# Patient Record
Sex: Male | Born: 1950 | Race: White | Hispanic: No | Marital: Married | Smoking: Former smoker
Health system: Southern US, Community
[De-identification: ages and names within clinical notes are randomized; demographics above are authoritative.]

## PROBLEM LIST (undated history)

## (undated) DIAGNOSIS — Z973 Presence of spectacles and contact lenses: Secondary | ICD-10-CM

## (undated) DIAGNOSIS — S82843A Displaced bimalleolar fracture of unspecified lower leg, initial encounter for closed fracture: Secondary | ICD-10-CM

## (undated) DIAGNOSIS — I493 Ventricular premature depolarization: Secondary | ICD-10-CM

## (undated) DIAGNOSIS — F32A Depression, unspecified: Secondary | ICD-10-CM

## (undated) DIAGNOSIS — F4310 Post-traumatic stress disorder, unspecified: Secondary | ICD-10-CM

## (undated) DIAGNOSIS — Z87442 Personal history of urinary calculi: Secondary | ICD-10-CM

## (undated) DIAGNOSIS — H2000 Unspecified acute and subacute iridocyclitis: Secondary | ICD-10-CM

## (undated) DIAGNOSIS — F329 Major depressive disorder, single episode, unspecified: Secondary | ICD-10-CM

## (undated) DIAGNOSIS — L309 Dermatitis, unspecified: Secondary | ICD-10-CM

## (undated) DIAGNOSIS — R0789 Other chest pain: Secondary | ICD-10-CM

## (undated) DIAGNOSIS — I499 Cardiac arrhythmia, unspecified: Secondary | ICD-10-CM

## (undated) DIAGNOSIS — R3911 Hesitancy of micturition: Secondary | ICD-10-CM

## (undated) HISTORY — PX: APPENDECTOMY: SHX54

## (undated) HISTORY — DX: Ventricular premature depolarization: I49.3

## (undated) HISTORY — DX: Post-traumatic stress disorder, unspecified: F43.10

## (undated) HISTORY — PX: HERNIA REPAIR: SHX51

## (undated) HISTORY — DX: Other chest pain: R07.89

## (undated) HISTORY — PX: ABLATION: SHX5711

---

## 2004-09-06 ENCOUNTER — Ambulatory Visit (HOSPITAL_COMMUNITY): Admission: RE | Admit: 2004-09-06 | Discharge: 2004-09-06 | Payer: Self-pay | Admitting: *Deleted

## 2011-02-28 ENCOUNTER — Ambulatory Visit (HOSPITAL_COMMUNITY)
Admission: RE | Admit: 2011-02-28 | Discharge: 2011-02-28 | Disposition: A | Discharge status: Home / Self Care | Payer: BC Managed Care – PPO | Source: Ambulatory Visit | Attending: Cardiology | Admitting: Cardiology

## 2011-02-28 DIAGNOSIS — I499 Cardiac arrhythmia, unspecified: Secondary | ICD-10-CM | POA: Insufficient documentation

## 2011-04-08 ENCOUNTER — Observation Stay (HOSPITAL_COMMUNITY)
Admission: EM | Admit: 2011-04-08 | Discharge: 2011-04-08 | Disposition: A | Discharge status: Home / Self Care | Payer: BC Managed Care – PPO | Attending: Internal Medicine | Admitting: Internal Medicine

## 2011-04-08 ENCOUNTER — Emergency Department (HOSPITAL_COMMUNITY): Payer: BC Managed Care – PPO

## 2011-04-08 DIAGNOSIS — R0602 Shortness of breath: Secondary | ICD-10-CM | POA: Insufficient documentation

## 2011-04-08 DIAGNOSIS — R0789 Other chest pain: Principal | ICD-10-CM | POA: Insufficient documentation

## 2011-04-08 DIAGNOSIS — R61 Generalized hyperhidrosis: Secondary | ICD-10-CM | POA: Insufficient documentation

## 2011-04-08 DIAGNOSIS — F4310 Post-traumatic stress disorder, unspecified: Secondary | ICD-10-CM | POA: Insufficient documentation

## 2011-04-08 LAB — CK TOTAL AND CKMB (NOT AT ARMC): CK, MB: 3.7 ng/mL (ref 0.3–4.0)

## 2011-04-08 LAB — BASIC METABOLIC PANEL
Calcium: 8.9 mg/dL (ref 8.4–10.5)
GFR calc non Af Amer: >60 mL/min (ref >60)
Glucose, Bld: 132 mg/dL — ABNORMAL HIGH (ref 70–99)
Sodium: 140 mEq/L (ref 135–145)

## 2011-04-08 LAB — LIPID PANEL
HDL: 49 mg/dL (ref >39)
LDL Cholesterol: 138 mg/dL — ABNORMAL HIGH (ref 0–99)
Total CHOL/HDL Ratio: 4.6 RATIO
Triglycerides: 178 mg/dL — ABNORMAL HIGH (ref <150)
VLDL: 36 mg/dL (ref 0–40)

## 2011-04-08 LAB — CARDIAC PANEL(CRET KIN+CKTOT+MB+TROPI)
Relative Index: INVALID (ref 0.0–2.5)
Troponin I: <0.3 ng/mL (ref <0.30)
Troponin I: <0.3 ng/mL (ref <0.30)

## 2011-04-08 LAB — CBC
Hemoglobin: 14.9 g/dL (ref 13.0–17.0)
MCH: 30.8 pg (ref 26.0–34.0)
MCHC: 36.1 g/dL — ABNORMAL HIGH (ref 30.0–36.0)

## 2011-04-08 LAB — DIFFERENTIAL
Basophils Relative: 1 % (ref 0–1)
Eosinophils Absolute: 0.1 10*3/uL (ref 0.0–0.7)
Monocytes Absolute: 0.4 10*3/uL (ref 0.1–1.0)
Monocytes Relative: 7 % (ref 3–12)
Neutro Abs: 4.2 10*3/uL (ref 1.7–7.7)

## 2011-04-30 ENCOUNTER — Encounter: Payer: Self-pay | Admitting: Internal Medicine

## 2011-05-01 ENCOUNTER — Ambulatory Visit (INDEPENDENT_AMBULATORY_CARE_PROVIDER_SITE_OTHER): Payer: BC Managed Care – PPO | Admitting: Internal Medicine

## 2011-05-01 ENCOUNTER — Encounter: Payer: Self-pay | Admitting: Internal Medicine

## 2011-05-01 DIAGNOSIS — E785 Hyperlipidemia, unspecified: Secondary | ICD-10-CM

## 2011-05-01 DIAGNOSIS — I493 Ventricular premature depolarization: Secondary | ICD-10-CM | POA: Insufficient documentation

## 2011-05-01 DIAGNOSIS — I4949 Other premature depolarization: Secondary | ICD-10-CM

## 2011-05-01 NOTE — Progress Notes (Signed)
HPI Peter Hensley is referred today by Dr. Donato Schultz for evaluation of frequent ventricular ectopy. The patient notes that he has had a history of an irregular heartbeat for several years. This appears to have increased in frequency and severity. Despite this he has remained well. He is active and walks runs on a regular basis. He does not think he has had any limitation in his activity level. He has never had frank syncope. Initial evaluation demonstrated frequent premature ventricular contractions at times in a bigeminal fashion. Subsequent cardiac monitoring has demonstrated that he has had over 34,000 premature ventricular beats and a 24-hour period. In addition a 2-D echo has demonstrated low normal left ventricular systolic function. He has minimal to no palpitations. Compared to several years ago, he thinks that his energy level may have decreased somewhat. No Known Allergies   Current Outpatient Prescriptions  Medication Sig Dispense Refill  . acetaminophen-codeine (TYLENOL #3) 300-30 MG per tablet Take 1 tablet by mouth every 4 (four) hours as needed.        Marland Kitchen buPROPion (WELLBUTRIN) 75 MG tablet Take 75 mg by mouth 2 (two) times daily.        . divalproex (DEPAKOTE) 500 MG 24 hr tablet Take 500 mg by mouth daily.        . meloxicam (MOBIC) 15 MG tablet Take 15 mg by mouth daily.        . prazosin (MINIPRESS) 5 MG capsule Take 5 mg by mouth at bedtime.        . sertraline (ZOLOFT) 50 MG tablet Take 50 mg by mouth daily.        . simvastatin (ZOCOR) 40 MG tablet Take 40 mg by mouth at bedtime.        . traZODone (DESYREL) 100 MG tablet Take 100 mg by mouth at bedtime.           Past Medical History  Diagnosis Date  . PVC's (premature ventricular contractions)   . Post traumatic stress disorder     with occasional panic attacks  . Atypical chest pain     ROS:   All systems reviewed and negative except as noted in the HPI.   No past surgical history on file.   No family history  on file.   History   Social History  . Marital Status: Married    Spouse Name: N/A    Number of Children: N/A  . Years of Education: N/A   Occupational History  . Not on file.   Social History Main Topics  . Smoking status: Never Smoker   . Smokeless tobacco: Not on file  . Alcohol Use: Yes     Occas.   . Drug Use: No  . Sexually Active: Not on file   Other Topics Concern  . Not on file   Social History Narrative   He is a [place] vet. Highly functioning and exercises on a regular basis.      BP 119/64  Pulse 91  Ht 5\' 10"  (1.778 m)  Wt 185 lb (83.915 kg)  BMI 26.54 kg/m2  Physical Exam:  Well appearing NAD HEENT: Unremarkable Neck:  No JVD, no thyromegally Lymphatics:  No adenopathy Back:  No CVA tenderness Lungs:  Clear HEART:  Iregular rate rhythm, no murmurs, no rubs, no clicks Abd:  soft, positive bowel sounds, no organomegally, no rebound, no guarding Ext:  2 plus pulses, no edema, no cyanosis, no clubbing Skin:  No rashes no nodules Neuro:  CN  II through XII intact, motor grossly intact  EKG Normal sinus rhythm with frequent ventricular ectopy in a bigeminal fashion  Assess/Plan:

## 2011-05-01 NOTE — Assessment & Plan Note (Signed)
I have discussed the treatment options with the patient. Although he is minimally symptomatic, is very dense ventricular ectopy and his low normal ejection fraction suggests that he will do ultimately go on and develop a tachycardia induced cardiomyopathy. Treatment options were carefully laid out. Medical therapy would certainly be our first option though frequently medications are unhelpful in this problem. After discussing his medical options, the patient strongly prefers not to try taking medications even though I have informed him that this is my first choice for most patients. The patient is interested in proceeding with catheter ablation. The risks, goals, benefits, and expectations of the procedure have been discussed with the patient and he would like to call us when he chooses to proceed with this therapy.

## 2011-05-01 NOTE — Assessment & Plan Note (Signed)
He will continue his current medical therapy. Regular exercise and a low fat diet are recommended.

## 2011-05-02 NOTE — Discharge Summary (Signed)
  NAME:  Peter Hensley, SHELLER NO.:  [id number]  MEDICAL RECORD NO.:  [id number]  LOCATION:  3707                         FACILITY:  [facility]  PHYSICIAN:  Zannie Cove, MD     DATE OF BIRTH:  05/19/1951  DATE OF ADMISSION:  04/08/2011 DATE OF DISCHARGE:  04/08/2011                              DISCHARGE SUMMARY   PRIMARY CARE PHYSICIAN:  Unassigned.  CARDIOLOGIST:  Jake Bathe, MD, [facility]  DISCHARGE DIAGNOSES: 1. Atypical chest pain likely secondary to panic attack versus     premature ventricular contractions, resolved. 2. History of stress test 2 months ago, normal. 3. Post-traumatic stress disorder. 4. History of panic attacks.  DISCHARGE MEDICATIONS: 1. Adderall 20 mg 1/2 tablet daily. 2. Aspirin 81 mg daily. 3. Bupropion 1 tablet daily. 4. Prazosin 1 tablet daily. 5. Sertraline 1 tablet daily. 6. Simvastatin 80 mg 1/2 tablet daily. 7. Trazodone 50 mg 1 tablet daily.  DIAGNOSTICS/INVESTIGATIONS:  Chest x-ray showed no acute abnormalities. 2-D echocardiogram showed LV cavity size normal, EF of 50%, and wall motion is normal.  HOSPITAL COURSE:  [name] is a pleasant 60 year old [place] veteran who presented to the hospital with some chest tightness.  He had some panic attacks and flashbacks the night before and subsequently admitted to the hospital, ruled out for MI based on 3 sets of cardiac enzymes and had a normal 2-D echo done without any wall motion abnormality.  In addition, the patient also had a stress test done 2 months ago per [facility] which was reportedly normal.  Hence, the patient is being discharged in stable condition to follow up with Dr. Anne Fu for possible evaluation for PVCs or arrhythmias, etc.  DISCHARGE CONDITION:  Stable.  DISCHARGE FOLLOWUP:  Dr. Donato Schultz in 2-3 weeks.     Zannie Cove, MD     PJ/MEDQ  D:  05/01/2011  T:  05/01/2011  Job:  [id number]  Electronically Signed by Zannie Cove  on  05/02/2011 01:30:38 PM

## 2011-05-10 NOTE — H&P (Signed)
NAME:  Peter Hensley, Peter Hensley NO.:  [id number]  MEDICAL RECORD NO.:  [id number]  LOCATION:  [facility]                         FACILITY:  [facility]  PHYSICIAN:  Tarry Kos, MD       DATE OF BIRTH:  03/05/1951  DATE OF ADMISSION:  04/08/2011 DATE OF DISCHARGE:                             HISTORY & PHYSICAL   CHIEF COMPLAINT:  Chest pressure.  HISTORY OF PRESENT ILLNESS:  Peter Hensley is a pleasant 60 year old male who has a history of PTSD a [place] veteran who has no other medical issues who presents to emergency department after being woken up from sleep with chest tightness that was really in his lower chest, epigastric area, and was very short of breath and diaphoretic, lasted about 10 minutes.  He called EMS and was told to take some aspirin, which relieved his chest pressure.  He has a history of panic attacks from his PTSD, and he frequently does have flashbacks and nightmares at night, and he thinks that he was having some flashbacks whenever he was woken up tonight, but he normally does not have chest pressure with his panic attacks, so it worried him and that is why he called EMS.  Since he has been in the emergency room, he has been chest pain-free.  The only thing he got was aspirin.  He exercises regularly.  He denies any recent fevers, nausea, vomiting, diarrhea, or any recent cough.  PAST MEDICAL HISTORY:  PVCs, post-traumatic stress disorder with occasional panic attacks.  SOCIAL HISTORY:  He is a [place] vet.  He is a nonsmoker, never smoked. Occasional alcohol and no other drugs.  He is highly functioning and exercises on a regular basis.  MEDICATIONS:  He takes bupropion, prazosin, sertraline, and trazodone.  ALLERGIES:  None.  PHYSICAL EXAMINATION:  VITAL SIGNS:  Temperature is 97.6, blood pressure 119/76, pulse 63, respirations 11, 100% O2 sats on room air. GENERAL:  He is alert and oriented x4, in no apparent stress, cooperative, friendly, appears  much younger than his stated age. HEENT:  Extraocular muscles intact.  Pupils equal and reactive to light. Oropharynx is clear.  Mucous membranes are moist. NECK:  No JVD.  No carotid bruits. COR:  Regular rate and rhythm without murmurs or gallops. CHEST:  Clear to auscultation bilaterally.  No wheezes, rhonchi or rales. ABDOMEN:  Soft, nontender, nondistended.  Positive bowel sounds.  No hepatosplenomegaly. EXTREMITIES:  Without clubbing,  cyanosis, or edema. PSYCH:  Normal mood and affect. NEURO:  No focal neurologic deficits. SKIN:  No rashes.  LABORATORY DATA:  Cardiac enzymes were negative.  A 12-lead EKG, normal sinus rhythm with occasional PVCs.  Electrolytes normal.  BUN and creatinine normal.  White count normal.  Hemoglobin normal.  Chest x-ray is negative.  ASSESSMENT AND PLAN:  This is a 60 year old male with atypical chest pain. 1. Atypical chest pain.  We will continue to rule out serial cardiac     enzymes and obtain a 2D echo.  However, this could probably be done     as an outpatient and he probably can get further outpatient stress     testing also with the [place].  This almost sounds  like it is related to     his posttraumatic stress disorder, but will obviously do serial     cardiac enzymes and rule out a cardiac source with further     outpatient workup if he rules out.  Place on telemetry and monitor     for any arrhythmias. 2. Posttraumatic stress disorder.  We will continue his home     medications for that. 3. The patient is full code.  Further recommendations pending on     overall hospital course.  We will also check a lipid panel in the     morning, fasting lipid panel.          ______________________________ Tarry Kos, MD     RD/MEDQ  D:  04/08/2011  T:  04/08/2011  Job:  [id number]  Electronically Signed by Tarry Kos MD on 05/10/2011 11:47:43 AM

## 2011-05-24 ENCOUNTER — Ambulatory Visit (INDEPENDENT_AMBULATORY_CARE_PROVIDER_SITE_OTHER): Payer: BC Managed Care – PPO | Admitting: *Deleted

## 2011-05-24 ENCOUNTER — Encounter: Payer: Self-pay | Admitting: *Deleted

## 2011-05-24 DIAGNOSIS — I493 Ventricular premature depolarization: Secondary | ICD-10-CM

## 2011-05-24 DIAGNOSIS — E785 Hyperlipidemia, unspecified: Secondary | ICD-10-CM

## 2011-05-24 DIAGNOSIS — I4949 Other premature depolarization: Secondary | ICD-10-CM

## 2011-05-24 LAB — CBC WITH DIFFERENTIAL/PLATELET
Basophils Relative: 0.7 % (ref 0.0–3.0)
Eosinophils Relative: 2.4 % (ref 0.0–5.0)
HCT: 46.8 % (ref 39.0–52.0)
Hemoglobin: 16.1 g/dL (ref 13.0–17.0)
Lymphs Abs: 1.6 10*3/uL (ref 0.7–4.0)
MCV: 89.5 fl (ref 78.0–100.0)
Monocytes Absolute: 0.4 10*3/uL (ref 0.1–1.0)
RBC: 5.23 Mil/uL (ref 4.22–5.81)
WBC: 5.1 10*3/uL (ref 4.5–10.5)

## 2011-05-24 LAB — BASIC METABOLIC PANEL
Chloride: 103 mEq/L (ref 96–112)
Potassium: 4.1 mEq/L (ref 3.5–5.1)
Sodium: 140 mEq/L (ref 135–145)

## 2011-05-24 LAB — PROTIME-INR: INR: 0.9 ratio (ref 0.8–1.0)

## 2011-05-28 ENCOUNTER — Telehealth: Payer: Self-pay | Admitting: *Deleted

## 2011-05-28 NOTE — Telephone Encounter (Signed)
lmom for pt to call me back regarding his procedure for 06/08/11

## 2011-05-29 NOTE — Telephone Encounter (Signed)
Procedure date changed.  Pt aware

## 2011-06-01 ENCOUNTER — Encounter: Payer: Self-pay | Admitting: Internal Medicine

## 2011-06-05 ENCOUNTER — Encounter: Payer: Self-pay | Admitting: Internal Medicine

## 2011-06-06 ENCOUNTER — Ambulatory Visit (INDEPENDENT_AMBULATORY_CARE_PROVIDER_SITE_OTHER): Payer: BC Managed Care – PPO | Admitting: *Deleted

## 2011-06-06 ENCOUNTER — Telehealth: Payer: Self-pay | Admitting: Internal Medicine

## 2011-06-06 DIAGNOSIS — I493 Ventricular premature depolarization: Secondary | ICD-10-CM

## 2011-06-06 DIAGNOSIS — E785 Hyperlipidemia, unspecified: Secondary | ICD-10-CM

## 2011-06-06 DIAGNOSIS — I43 Cardiomyopathy in diseases classified elsewhere: Secondary | ICD-10-CM

## 2011-06-06 DIAGNOSIS — R0989 Other specified symptoms and signs involving the circulatory and respiratory systems: Secondary | ICD-10-CM

## 2011-06-06 LAB — CBC WITH DIFFERENTIAL/PLATELET
Eosinophils Absolute: 0.1 10*3/uL (ref 0.0–0.7)
Hemoglobin: 15.1 g/dL (ref 13.0–17.0)
Lymphocytes Relative: 32 % (ref 12–46)
Lymphs Abs: 1.7 10*3/uL (ref 0.7–4.0)
MCH: 30.8 pg (ref 26.0–34.0)
Monocytes Relative: 6 % (ref 3–12)
Neutrophils Relative %: 58 % (ref 43–77)
RBC: 4.9 MIL/uL (ref 4.22–5.81)
WBC: 5.3 10*3/uL (ref 4.0–10.5)

## 2011-06-06 LAB — PROTIME-INR: INR: 0.95 (ref <1.50)

## 2011-06-06 LAB — BASIC METABOLIC PANEL WITH GFR
CO2: 26 mEq/L (ref 19–32)
GFR, Est African American: >60 mL/min (ref >60)
Glucose, Bld: 87 mg/dL (ref 70–99)
Potassium: 3.7 mEq/L (ref 3.5–5.3)
Sodium: 141 mEq/L (ref 135–145)

## 2011-06-06 NOTE — Telephone Encounter (Signed)
Called patient back. He states that he is calling Peter Hensley back concerning lab work. Advised that she is out to lunch and will call him back in about 1 hour.

## 2011-06-06 NOTE — Telephone Encounter (Signed)
Called patient and LM on private cell phone to have lab work done today prior to 430pm.

## 2011-06-06 NOTE — Telephone Encounter (Signed)
Patient calling regarding blood work &  procedure. Pt states lab work was done on 8/9.

## 2011-06-06 NOTE — Telephone Encounter (Signed)
Pt rtn call regarding lab work for procedure for tomorrow had it done already

## 2011-06-07 ENCOUNTER — Ambulatory Visit (HOSPITAL_COMMUNITY)
Admission: RE | Admit: 2011-06-07 | Discharge: 2011-06-08 | Disposition: A | Discharge status: Home / Self Care | Payer: BC Managed Care – PPO | Source: Ambulatory Visit | Attending: Internal Medicine | Admitting: Internal Medicine

## 2011-06-07 DIAGNOSIS — I4729 Other ventricular tachycardia: Secondary | ICD-10-CM | POA: Insufficient documentation

## 2011-06-07 DIAGNOSIS — I472 Ventricular tachycardia, unspecified: Secondary | ICD-10-CM | POA: Insufficient documentation

## 2011-06-07 DIAGNOSIS — I4949 Other premature depolarization: Secondary | ICD-10-CM | POA: Insufficient documentation

## 2011-06-07 DIAGNOSIS — E785 Hyperlipidemia, unspecified: Secondary | ICD-10-CM | POA: Insufficient documentation

## 2011-06-07 DIAGNOSIS — F410 Panic disorder [episodic paroxysmal anxiety] without agoraphobia: Secondary | ICD-10-CM | POA: Insufficient documentation

## 2011-07-04 ENCOUNTER — Ambulatory Visit (INDEPENDENT_AMBULATORY_CARE_PROVIDER_SITE_OTHER): Payer: BC Managed Care – PPO | Admitting: Internal Medicine

## 2011-07-04 ENCOUNTER — Encounter: Payer: Self-pay | Admitting: Internal Medicine

## 2011-07-04 VITALS — BP 110/70 | HR 93 | Ht 69.0 in | Wt 182.4 lb

## 2011-07-04 DIAGNOSIS — I493 Ventricular premature depolarization: Secondary | ICD-10-CM

## 2011-07-04 DIAGNOSIS — I4949 Other premature depolarization: Secondary | ICD-10-CM

## 2011-07-04 NOTE — Progress Notes (Signed)
HPI Peter Hensley returns today for followup. 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 He was a fracture to medical therapy and underwent catheter ablation of his PVCs. The PVCs originated from the right ventricular outflow tract and they were successfully ablated. He is much improved. His weakness and fatigue have resolved. He has no palpitations. He is off medical therapy. He has had no syncope. No Known Allergies   Current Outpatient Prescriptions  Medication Sig Dispense Refill  . acetaminophen-codeine (TYLENOL #3) 300-30 MG per tablet Take 1 tablet by mouth every 4 (four) hours as needed.        Marland Kitchen buPROPion (WELLBUTRIN) 75 MG tablet Take 75 mg by mouth 2 (two) times daily.        . divalproex (DEPAKOTE) 500 MG 24 hr tablet Take 500 mg by mouth daily.        . meloxicam (MOBIC) 15 MG tablet Take 15 mg by mouth daily.        . prazosin (MINIPRESS) 5 MG capsule Take 5 mg by mouth at bedtime.        . sertraline (ZOLOFT) 50 MG tablet Take 50 mg by mouth daily.        . simvastatin (ZOCOR) 40 MG tablet Take 40 mg by mouth at bedtime.        . traZODone (DESYREL) 100 MG tablet Take 100 mg by mouth at bedtime.           Past Medical History  Diagnosis Date  . PVC's (premature ventricular contractions)   . Post traumatic stress disorder     with occasional panic attacks  . Atypical chest pain     ROS:   All systems reviewed and negative except as noted in the HPI.   No past surgical history on file.   No family history on file.   History   Social History  . Marital Status: Married    Spouse Name: N/A    Number of Children: N/A  . Years of Education: N/A   Occupational History  . Not on file.   Social History Main Topics  . Smoking status: Former Smoker    Quit date: 07/03/1996  . Smokeless tobacco: Not on file  . Alcohol Use: Yes     Occas.   . Drug Use: No  . Sexually  Active: Not on file   Other Topics Concern  . Not on file   Social History Narrative   He is a [place] vet. Highly functioning and exercises on a regular basis.      BP 110/70  Pulse 93  Ht 5\' 9"  (1.753 m)  Wt 182 lb 6.4 oz (82.736 kg)  BMI 26.94 kg/m2  Physical Exam:  Well appearing NAD HEENT: Unremarkable Neck:  No JVD, no thyromegally Lymphatics:  No adenopathy Back:  No CVA tenderness Lungs:  Clear HEART:  Regular rate rhythm, no murmurs, no rubs, no clicks Abd:  soft, positive bowel sounds, no organomegally, no rebound, no guarding Ext:  2 plus pulses, no edema, no cyanosis, no clubbing Skin:  No rashes no nodules Neuro:  CN II through XII intact, motor grossly intact  EKG Normal sinus rhythm  Assess/Plan:

## 2011-07-04 NOTE — Assessment & Plan Note (Signed)
His symptoms have resolved. I have recommended a period of watchful waiting. He'll call me if he has recurrence.

## 2011-07-12 NOTE — Progress Notes (Signed)
Addended by: Burnett Kanaris A on: 07/12/2011 01:56 PM   Modules accepted: Orders

## 2011-07-16 ENCOUNTER — Telehealth: Payer: Self-pay | Admitting: Internal Medicine

## 2011-07-17 NOTE — Telephone Encounter (Signed)
Discussed with Dr Ladona Ridgel and he says as long as he continues to feel okay, we are going to keep a watch on things and not make any changes at this point  He will call if these progress of do not get any better

## 2011-07-26 NOTE — Op Note (Signed)
NAME:  Peter Hensley, UHRICH NO.:  [id number]  MEDICAL RECORD NO.:  [id number]  LOCATION:  2807                         FACILITY:  [facility]  PHYSICIAN:  Doylene Canning. Ladona Ridgel, MD    DATE OF BIRTH:  05-07-1951  DATE OF PROCEDURE:  06/07/2011 DATE OF DISCHARGE:                              OPERATIVE REPORT   PROCEDURE PERFORMED:  Ablation of VT/PVCs in a bigeminal fashion from the RV outflow tract.  INTRODUCTION:  The patient is a 60 year old man with a history of symptomatic tachy palpitations and documented dense ventricular ectopy. He had 34,000 PVCs in a 24-hour period.  The patient was treated with medical therapy, but did not have improvement in his symptoms and he is now referred for catheter ablation.  DESCRIPTION OF PROCEDURE:  After informed consent was obtained, the patient was taken to the diagnostic EP lab in a fasting state.  After usual preparation and draping, lidocaine was infiltrated into the right inguinal region as well as right jugular region.  A 6-French quadripolar catheter was inserted percutaneously in the right femoral vein and advanced into the right ventricle.  A 6-French quadripolar catheter was inserted percutaneously in the right femoral vein and advanced to his bundle region.  A 6-French hexapolar catheter was inserted percutaneously in the right jugular vein and advanced to coronary sinus. Mapping was carried out demonstrating PVCs typically in a bigeminal fashion.  These appeared to be coming from the RV outflow tract with transition from negative to positive at V3.  The 7-French quadripolar electroanatomic ablation catheter was then advanced into the right femoral vein and up into the right ventricle.  A 3D electroanatomic mapping was carried out first to make a shell of the right ventricle and then to map the activation sequence of the patient's PVCs.  Care was taken not to map the catheter-induced PVCs.  This demonstrated that  the location of the PVCs was in the RV outflow tract at a location approximately 2 cm below the pulmonic valve.  The PVCs were somewhat anterior on the RV outflow tract.  In the LAO projection, they were leftward.  A total of 11 RF energy applications were subsequently delivered with electroanatomic mapping.  This resulted in immediate cessation of PVCs.  The patient was then observed for 30 minutes. During this time, rapid ventricular pacing and rapid atrial pacing was carried out from the right ventricle and the coronary sinus respectively and programmed ventricular stimulation and programmed atrial stimulation was carried out from the right ventricle and from the coronary sinus respectively.  There was no inducible VT and there were no additional PVCs.  The catheter was then removed.  Hemostasis was assured and the patient was returned to his room in satisfactory condition.  COMPLICATIONS:  There were no immediate procedure complications.  RESULTS:  A.  Baseline ECG.  Baseline ECG demonstrates sinus rhythm with PVCs in a bigeminal fashion.B.  Baseline intervals, sinus node cycle length was 895 milliseconds. HV interval was 39 milliseconds.  The QRS duration was 100 milliseconds. C.  Rapid ventricular pacing.  Rapid ventricular pacing following ablation demonstrated no inducible VT.  The pacing interval stepwise decreased down to 300 milliseconds. D.  Programmed ventricular simulation.  Programmed ventricular stimulation was carried out down to the S1-S2 coupling interval of 600/540 and stepwise decreased down to 400 milliseconds.  The retrograde AV node ERP was demonstrated.  During programmed ventricular stimulation, the atrial activation was midline decremental.  No inducible arrhythmias. E.  Rapid atrial pacing.  Rapid atrial pacing was carried out from the coronary sinus at a pacing cycle length of 600 milliseconds, stepwise decreased down to 80 milliseconds demonstrating AV  Wenckebach.  During rapid atrial pacing, the PR interval was less than the RR interval and there was no inducible VT or SVT. F.  Programmed atrial stimulation.  Programmed atrial stimulation was carried out from the coronary sinus at base drive cycle of 409 milliseconds.  The S1 and S2 interval was stepwise decreased down to 20 milliseconds with AV node ERP was observed.  During programmed atrial stimulation, there were no AH jumps, no echo beats, and no inducible SVT or VT. G.  Arrhythmias observed. 1. Nonsustained VT and bigeminal PVCs, initiation present at the time     of EP study, the duration was sustained, termination was with     catheter ablation.     a.     Mapping.  Mapping of the PVCs demonstrated that the earliest      activation was in the right ventricular outflow tract      approximately 2 cm below the pulmonic valve annulus on the      anterior portion of the RV outflow tract.     b.     RF energy application.  A total of 11 RF energy applications      were delivered to the RV outflow tract at the earliest ventricular      activation of the PVCs resulting in termination of the PVCs.  They      were not inducible.  CONCLUSION:  This study demonstrates successful electrophysiologic study and catheter ablation of dense ventricular ectopy originating from the RV outflow tract.  A total of 11 RF energy applications were delivered resulting in termination of the patient's ventricular ectopy.  Of note, the patient was observed for 30 minutes, and during this time, he was having ventricular bigemini to no PVCs.     Doylene Canning. Ladona Ridgel, MD     GWT/MEDQ  D:  06/07/2011  T:  06/07/2011  Job:  [id number]  cc:   Jake Bathe, MD  Electronically Signed by Lewayne Bunting MD on 07/26/2011 06:39:38 PM

## 2011-07-26 NOTE — Discharge Summary (Signed)
  NAME:  AZIAH, BROSTROM NO.:  [id number]  MEDICAL RECORD NO.:  [id number]  LOCATION:  3737                         FACILITY:  [facility]  PHYSICIAN:  Doylene Canning. Ladona Ridgel, MD    DATE OF BIRTH:  May 07, 1951  DATE OF ADMISSION:  06/07/2011 DATE OF DISCHARGE:  06/08/2011                              DISCHARGE SUMMARY   PRIMARY CARDIOLOGIST:  Jake Bathe, MD  ELECTROPHYSIOLOGIST:  Doylene Canning. Ladona Ridgel, MD  DISCHARGE DIAGNOSIS:  Symptomatic premature ventricular contractions.  SECONDARY DIAGNOSES: 1. History of atypical chest pain. 2. History of posttraumatic stress disorder. 3. Panic attacks. 4. Hyperlipidemia.  ALLERGIES:  No known drug allergies.  PROCEDURES:  On June 07, 2011, ablation of VT/PVCs in a bigeminal fashion from the RV outflow tract.  HISTORY OF PRESENT ILLNESS:  A 60 year old male with prior history of symptomatic PVCs, recently seen in clinic by Dr. Ladona Ridgel on May 01, 2011.  At that time, prior cardiac monitoring was reviewed showing over 34,000 PVCs in the 24-hour period.  Decision was made to pursue PVC ablation.  HOSPITAL COURSE:  The patient presented to the EP Lab on June 07, 2011, where he underwent successful VT/PVC ablation.  Postprocedure, he was ambulating without recurrent symptoms or limitations, would be discharged to home today in good condition.  DISCHARGE LABS:  None.  DISPOSITION:  The patient is being discharged home today in good condition.  FOLLOWUP PLANS AND APPOINTMENTS:  The patient will follow up with Dr. Lewayne Bunting on July 04, 2011 at 2 o'clock p.m.  Follow up with Dr. Anne Fu as previously scheduled.  DISCHARGE MEDICATIONS: 1. Adderall 20 mg half a tablet daily. 2. Aspirin 81 mg daily. 3. Divalproex 500 mg daily. 4. Prazosin 2 mg daily. 5. Sertraline 100 mg daily. 6. Simvastatin 80 mg half a tablet daily. 7. Trazodone 100 mg daily.  OUTSTANDING LABORATORY STUDIES:  None.  DURATION OF DISCHARGE  ENCOUNTERED:  Thirty five minutes including physician.     Nicolasa Ducking, ANP   ______________________________ Doylene Canning. Ladona Ridgel, MD    CB/MEDQ  D:  06/08/2011  T:  06/08/2011  Job:  [id number]  cc:   Jake Bathe, MD  Electronically Signed by Nicolasa Ducking ANP on 06/28/2011 03:15:26 PM Electronically Signed by Lewayne Bunting MD on 07/26/2011 06:39:43 PM

## 2011-08-14 ENCOUNTER — Telehealth: Payer: Self-pay | Admitting: Internal Medicine

## 2011-08-14 NOTE — Telephone Encounter (Signed)
[facility]bs said she cant reach pt case management services

## 2011-08-14 NOTE — Telephone Encounter (Signed)
Left message for Peter Hensley also and let her know that i had talked with the patient several weeks ago and not sure what else she wanted me to do

## 2011-08-14 NOTE — Telephone Encounter (Signed)
Tried to call and got pt's voicemail

## 2012-09-04 ENCOUNTER — Other Ambulatory Visit: Payer: Self-pay | Admitting: Orthopedic Surgery

## 2012-09-04 DIAGNOSIS — M25512 Pain in left shoulder: Secondary | ICD-10-CM

## 2012-09-16 ENCOUNTER — Ambulatory Visit
Admission: RE | Admit: 2012-09-16 | Discharge: 2012-09-16 | Disposition: A | Discharge status: Home / Self Care | Payer: BC Managed Care – PPO | Source: Ambulatory Visit | Attending: Orthopedic Surgery | Admitting: Orthopedic Surgery

## 2012-09-16 DIAGNOSIS — M25512 Pain in left shoulder: Secondary | ICD-10-CM

## 2012-09-16 MED ORDER — IOHEXOL 180 MG/ML  SOLN
15.0000 mL | Freq: Once | INTRAMUSCULAR | Status: AC | PRN
  Administered 2012-09-16: 15 mL via INTRA_ARTICULAR

## 2013-03-17 DIAGNOSIS — I499 Cardiac arrhythmia, unspecified: Secondary | ICD-10-CM | POA: Diagnosis not present

## 2013-03-17 DIAGNOSIS — Z86010 Personal history of colonic polyps: Secondary | ICD-10-CM | POA: Diagnosis not present

## 2013-03-17 DIAGNOSIS — Z79899 Other long term (current) drug therapy: Secondary | ICD-10-CM | POA: Diagnosis not present

## 2013-03-17 DIAGNOSIS — E785 Hyperlipidemia, unspecified: Secondary | ICD-10-CM | POA: Diagnosis not present

## 2013-03-17 DIAGNOSIS — F4310 Post-traumatic stress disorder, unspecified: Secondary | ICD-10-CM | POA: Diagnosis not present

## 2013-03-17 DIAGNOSIS — F988 Other specified behavioral and emotional disorders with onset usually occurring in childhood and adolescence: Secondary | ICD-10-CM | POA: Diagnosis not present

## 2013-03-17 DIAGNOSIS — Z1159 Encounter for screening for other viral diseases: Secondary | ICD-10-CM | POA: Diagnosis not present

## 2013-03-17 DIAGNOSIS — Z0000 Encounter for general adult medical examination without abnormal findings: Secondary | ICD-10-CM | POA: Diagnosis not present

## 2013-03-17 DIAGNOSIS — N529 Male erectile dysfunction, unspecified: Secondary | ICD-10-CM | POA: Diagnosis not present

## 2013-04-02 ENCOUNTER — Other Ambulatory Visit: Payer: Self-pay | Admitting: Orthopedic Surgery

## 2013-05-08 DIAGNOSIS — Z87891 Personal history of nicotine dependence: Secondary | ICD-10-CM | POA: Diagnosis not present

## 2013-05-20 DIAGNOSIS — M722 Plantar fascial fibromatosis: Secondary | ICD-10-CM | POA: Diagnosis not present

## 2013-08-10 DIAGNOSIS — Z23 Encounter for immunization: Secondary | ICD-10-CM | POA: Diagnosis not present

## 2013-09-18 ENCOUNTER — Ambulatory Visit: Payer: BC Managed Care – PPO | Admitting: Cardiology

## 2013-09-24 DIAGNOSIS — R29898 Other symptoms and signs involving the musculoskeletal system: Secondary | ICD-10-CM | POA: Diagnosis not present

## 2013-09-30 ENCOUNTER — Ambulatory Visit (INDEPENDENT_AMBULATORY_CARE_PROVIDER_SITE_OTHER): Payer: Medicare Other | Admitting: Cardiology

## 2013-09-30 ENCOUNTER — Encounter: Payer: Self-pay | Admitting: Cardiology

## 2013-09-30 VITALS — BP 157/94 | HR 106 | Ht 69.0 in | Wt 184.6 lb

## 2013-09-30 DIAGNOSIS — R030 Elevated blood-pressure reading, without diagnosis of hypertension: Secondary | ICD-10-CM

## 2013-09-30 DIAGNOSIS — E785 Hyperlipidemia, unspecified: Secondary | ICD-10-CM

## 2013-09-30 DIAGNOSIS — I493 Ventricular premature depolarization: Secondary | ICD-10-CM

## 2013-09-30 DIAGNOSIS — I4949 Other premature depolarization: Secondary | ICD-10-CM | POA: Diagnosis not present

## 2013-09-30 DIAGNOSIS — IMO0001 Reserved for inherently not codable concepts without codable children: Secondary | ICD-10-CM

## 2013-09-30 NOTE — Progress Notes (Signed)
[address] [place], [place]  [address] Phone: [phone] Fax:  [phone]  Date:  09/30/2013   ID:  Peter Hensley, DOB 11/10/1950, MRN [id number]  PCP:  Ronnie Doss   History of Present Illness: Peter Hensley is a 62 y.o. male here previously for the evaluation of bradycardia.an EKG performed on 01/25/11 demonstrates sinus rhythm with frequent PVCs (30000 on holter) mostly bigeminy pattern left bundle branch block morphology upright in 2,3, aVF. On 06/07/11 he underwent ablative therapy by Dr. Sharrell Ku for PVCs. He says almost immediately he started to feel better. I performed an echocardiogram (02/16/11) which showed a low normal ejection fraction 50% with no significant abnormalities with his right ventricular contours.       Wt Readings from Last 3 Encounters:  09/30/13 184 lb 9.6 oz (83.734 kg)  07/04/11 182 lb 6.4 oz (82.736 kg)  05/01/11 185 lb (83.915 kg)     Past Medical History  Diagnosis Date  . PVC's (premature ventricular contractions)   . Post traumatic stress disorder     with occasional panic attacks  . Atypical chest pain     No past surgical history on file.  Current Outpatient Prescriptions  Medication Sig Dispense Refill  . acetaminophen-codeine (TYLENOL #3) 300-30 MG per tablet Take 1 tablet by mouth every 4 (four) hours as needed.        Marland Kitchen buPROPion (WELLBUTRIN) 75 MG tablet Take 75 mg by mouth 2 (two) times daily.        . divalproex (DEPAKOTE) 500 MG 24 hr tablet Take 500 mg by mouth daily.        . meloxicam (MOBIC) 15 MG tablet Take 15 mg by mouth daily.        . prazosin (MINIPRESS) 5 MG capsule Take 5 mg by mouth at bedtime.        . sertraline (ZOLOFT) 50 MG tablet Take 50 mg by mouth daily.        . simvastatin (ZOCOR) 40 MG tablet Take 40 mg by mouth at bedtime.         No current facility-administered medications for this visit.    Allergies:   No Known Allergies  Social History:  The patient   reports that he quit smoking about 17 years ago. He does not have any smokeless tobacco history on file. He reports that he drinks alcohol. He reports that he does not use illicit drugs.   ROS:  Please see the history of present illness.   No syncope , CP, SOB, no palps.  No bleeding  PHYSICAL EXAM: VS:  BP 157/94  Pulse 106  Ht 5\' 9"  (1.753 m)  Wt 184 lb 9.6 oz (83.734 kg)  BMI 27.25 kg/m2 Well nourished, well developed, in no acute distress HEENT: normal Neck: no JVD Cardiac:  normal S1, S2; RRR; no murmur Lungs:  clear to auscultation bilaterally, no wheezing, rhonchi or rales Abd: soft, nontender, no hepatomegaly Ext: no edema Skin: warm and dry Neuro: no focal abnormalities noted  EKG: 09/30/13 Sinus rhythm rate 69 with no other abnormalities     ASSESSMENT AND PLAN:  1. Premature ventricular contractions-status post ablation by Dr. Ladona Ridgel in 2012. Pulse is only felt better. Doing great. No beta blocker, no calcium channel blocker. Continue. 2. Elevated blood pressure-elevated today. Continue to monitor. At [facility] not as high. He also sees Carilyn Goodpasture. He states that he is on less medication from [facility]. He will update [place] with a medication list. 3. Hyperlipidemia-currently on simvastatin-continue. Update from [place] medication list.  Signed, Donato Schultz, MD [facility]  09/30/2013 11:03 AM

## 2013-09-30 NOTE — Patient Instructions (Signed)
Your physician recommends that you continue on your current medications as directed. Please refer to the Current Medication list given to you today.  Your physician wants you to follow-up in: 1 year with Dr. Skains. You will receive a reminder letter in the mail two months in advance. If you don't receive a letter, please call our office to schedule the follow-up appointment.  

## 2013-11-12 DIAGNOSIS — F988 Other specified behavioral and emotional disorders with onset usually occurring in childhood and adolescence: Secondary | ICD-10-CM | POA: Diagnosis not present

## 2013-11-20 DIAGNOSIS — M7020 Olecranon bursitis, unspecified elbow: Secondary | ICD-10-CM | POA: Diagnosis not present

## 2013-11-25 DIAGNOSIS — M7020 Olecranon bursitis, unspecified elbow: Secondary | ICD-10-CM | POA: Diagnosis not present

## 2014-03-15 DIAGNOSIS — R5383 Other fatigue: Secondary | ICD-10-CM | POA: Diagnosis not present

## 2014-03-15 DIAGNOSIS — R5381 Other malaise: Secondary | ICD-10-CM | POA: Diagnosis not present

## 2014-03-25 DIAGNOSIS — N486 Induration penis plastica: Secondary | ICD-10-CM | POA: Diagnosis not present

## 2014-03-25 DIAGNOSIS — F988 Other specified behavioral and emotional disorders with onset usually occurring in childhood and adolescence: Secondary | ICD-10-CM | POA: Diagnosis not present

## 2014-03-25 DIAGNOSIS — E785 Hyperlipidemia, unspecified: Secondary | ICD-10-CM | POA: Diagnosis not present

## 2014-03-25 DIAGNOSIS — Z0000 Encounter for general adult medical examination without abnormal findings: Secondary | ICD-10-CM | POA: Diagnosis not present

## 2014-03-25 DIAGNOSIS — R351 Nocturia: Secondary | ICD-10-CM | POA: Diagnosis not present

## 2014-03-25 DIAGNOSIS — F4310 Post-traumatic stress disorder, unspecified: Secondary | ICD-10-CM | POA: Diagnosis not present

## 2014-03-25 DIAGNOSIS — N529 Male erectile dysfunction, unspecified: Secondary | ICD-10-CM | POA: Diagnosis not present

## 2014-03-25 DIAGNOSIS — Z86010 Personal history of colonic polyps: Secondary | ICD-10-CM | POA: Diagnosis not present

## 2014-09-07 DIAGNOSIS — K4090 Unilateral inguinal hernia, without obstruction or gangrene, not specified as recurrent: Secondary | ICD-10-CM | POA: Diagnosis not present

## 2014-09-23 DIAGNOSIS — F4310 Post-traumatic stress disorder, unspecified: Secondary | ICD-10-CM | POA: Diagnosis not present

## 2014-09-23 DIAGNOSIS — K4090 Unilateral inguinal hernia, without obstruction or gangrene, not specified as recurrent: Secondary | ICD-10-CM | POA: Diagnosis not present

## 2014-09-23 DIAGNOSIS — Z87891 Personal history of nicotine dependence: Secondary | ICD-10-CM | POA: Diagnosis not present

## 2014-09-23 DIAGNOSIS — E785 Hyperlipidemia, unspecified: Secondary | ICD-10-CM | POA: Diagnosis not present

## 2014-09-23 DIAGNOSIS — F900 Attention-deficit hyperactivity disorder, predominantly inattentive type: Secondary | ICD-10-CM | POA: Diagnosis not present

## 2014-09-24 DIAGNOSIS — N529 Male erectile dysfunction, unspecified: Secondary | ICD-10-CM | POA: Diagnosis not present

## 2014-09-24 DIAGNOSIS — F900 Attention-deficit hyperactivity disorder, predominantly inattentive type: Secondary | ICD-10-CM | POA: Diagnosis not present

## 2014-11-12 DIAGNOSIS — K4090 Unilateral inguinal hernia, without obstruction or gangrene, not specified as recurrent: Secondary | ICD-10-CM | POA: Diagnosis not present

## 2016-01-28 ENCOUNTER — Emergency Department (HOSPITAL_COMMUNITY): Payer: Medicare Other

## 2016-01-28 ENCOUNTER — Emergency Department (HOSPITAL_COMMUNITY)
Admission: EM | Admit: 2016-01-28 | Discharge: 2016-01-28 | Disposition: A | Discharge status: Home / Self Care | Payer: Medicare Other | Attending: Emergency Medicine | Admitting: Emergency Medicine

## 2016-01-28 ENCOUNTER — Encounter (HOSPITAL_COMMUNITY): Payer: Self-pay | Admitting: Emergency Medicine

## 2016-01-28 DIAGNOSIS — Z87891 Personal history of nicotine dependence: Secondary | ICD-10-CM | POA: Insufficient documentation

## 2016-01-28 DIAGNOSIS — F4310 Post-traumatic stress disorder, unspecified: Secondary | ICD-10-CM | POA: Insufficient documentation

## 2016-01-28 DIAGNOSIS — M25511 Pain in right shoulder: Secondary | ICD-10-CM | POA: Diagnosis not present

## 2016-01-28 DIAGNOSIS — Y92410 Unspecified street and highway as the place of occurrence of the external cause: Secondary | ICD-10-CM | POA: Diagnosis not present

## 2016-01-28 DIAGNOSIS — Z79899 Other long term (current) drug therapy: Secondary | ICD-10-CM | POA: Insufficient documentation

## 2016-01-28 DIAGNOSIS — S82841A Displaced bimalleolar fracture of right lower leg, initial encounter for closed fracture: Secondary | ICD-10-CM | POA: Insufficient documentation

## 2016-01-28 DIAGNOSIS — Y998 Other external cause status: Secondary | ICD-10-CM | POA: Insufficient documentation

## 2016-01-28 DIAGNOSIS — Y9389 Activity, other specified: Secondary | ICD-10-CM | POA: Diagnosis not present

## 2016-01-28 DIAGNOSIS — S82831A Other fracture of upper and lower end of right fibula, initial encounter for closed fracture: Secondary | ICD-10-CM | POA: Diagnosis not present

## 2016-01-28 DIAGNOSIS — Z791 Long term (current) use of non-steroidal anti-inflammatories (NSAID): Secondary | ICD-10-CM | POA: Insufficient documentation

## 2016-01-28 DIAGNOSIS — M25561 Pain in right knee: Secondary | ICD-10-CM | POA: Diagnosis not present

## 2016-01-28 DIAGNOSIS — S99911A Unspecified injury of right ankle, initial encounter: Secondary | ICD-10-CM | POA: Diagnosis present

## 2016-01-28 DIAGNOSIS — S82891A Other fracture of right lower leg, initial encounter for closed fracture: Secondary | ICD-10-CM | POA: Diagnosis not present

## 2016-01-28 DIAGNOSIS — M25579 Pain in unspecified ankle and joints of unspecified foot: Secondary | ICD-10-CM

## 2016-01-28 DIAGNOSIS — S8991XA Unspecified injury of right lower leg, initial encounter: Secondary | ICD-10-CM | POA: Diagnosis not present

## 2016-01-28 MED ORDER — HYDROMORPHONE HCL 1 MG/ML IJ SOLN
1.0000 mg | Freq: Once | INTRAMUSCULAR | Status: AC
  Administered 2016-01-28: 1 mg via INTRAVENOUS
  Filled 2016-01-28: qty 1

## 2016-01-28 MED ORDER — KETAMINE HCL-SODIUM CHLORIDE 100-0.9 MG/10ML-% IV SOSY
0.3000 mg/kg | PREFILLED_SYRINGE | Freq: Once | INTRAVENOUS | Status: AC
  Administered 2016-01-28: 23 mg via INTRAVENOUS
  Filled 2016-01-28: qty 10

## 2016-01-28 MED ORDER — OXYCODONE-ACETAMINOPHEN 5-325 MG PO TABS: 1.0000 | ORAL_TABLET | ORAL | Status: DC | PRN

## 2016-01-28 MED ORDER — TETANUS-DIPHTHERIA TOXOIDS TD 5-2 LFU IM INJ
0.5000 mL | INJECTION | Freq: Once | INTRAMUSCULAR | Status: AC
  Administered 2016-01-28: 0.5 mL via INTRAMUSCULAR
  Filled 2016-01-28: qty 0.5

## 2016-01-28 NOTE — ED Notes (Signed)
Ortho tech and MD at bedside repositioning ankle.
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
## 2016-01-28 NOTE — Progress Notes (Signed)
Orthopedic Tech Progress Note Patient Details:  Peter Hensley 07/20/1951 [id number]  Ortho Devices Type of Ortho Device: Ace wrap, Crutches, Post (short leg) splint, Stirrup splint Ortho Device/Splint Location: RLE Ortho Device/Splint Interventions: Ordered, Application   Braulio Bosch 01/28/2016, 9:43 PM
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
## 2016-01-28 NOTE — Discharge Instructions (Signed)
Ankle Fracture A fracture is a break in a bone. The ankle joint is made up of three bones. These include the lower (distal)sections of your lower leg bones, called the tibia and fibula, along with a bone in your foot, called the talus. Depending on how bad the break is and if more than one ankle joint bone is broken, a cast or splint is used to protect and keep your injured bone from moving while it heals. Sometimes, surgery is required to help the fracture heal properly.  There are two general types of fractures:  Stable fracture. This includes a single fracture line through one bone, with no injury to ankle ligaments. A fracture of the talus that does not have any displacement (movement of the bone on either side of the fracture line) is also stable.  Unstable fracture. This includes more than one fracture line through one or more bones in the ankle joint. It also includes fractures that have displacement of the bone on either side of the fracture line. CAUSES  A direct blow to the ankle.   Quickly and severely twisting your ankle.  Trauma, such as a car accident or falling from a significant height. RISK FACTORS You may be at a higher risk of ankle fracture if:  You have certain medical conditions.  You are involved in high-impact sports.  You are involved in a high-impact car accident. SIGNS AND SYMPTOMS   Tender and swollen ankle.  Bruising around the injured ankle.  Pain on movement of the ankle.  Difficulty walking or putting weight on the ankle.  A cold foot below the site of the ankle injury. This can occur if the blood vessels passing through your injured ankle were also damaged.  Numbness in the foot below the site of the ankle injury. DIAGNOSIS  An ankle fracture is usually diagnosed with a physical exam and X-rays. A CT scan may also be required for complex fractures. TREATMENT  Stable fractures are treated with a cast or splint and using crutches to avoid putting  weight on your injured ankle. This is followed by an ankle strengthening program. Some patients require a special type of cast, depending on other medical problems they may have. Unstable fractures require surgery to ensure the bones heal properly. Your health care provider will tell you what type of fracture you have and the best treatment for your condition. HOME CARE INSTRUCTIONS   Review correct crutch use with your health care provider and use your crutches as directed. Safe use of crutches is extremely important. Misuse of crutches can cause you to fall or cause injury to nerves in your hands or armpits.  Do not put weight or pressure on the injured ankle until directed by your health care provider.  To lessen the swelling, keep the injured leg elevated while sitting or lying down.  Apply ice to the injured area:  Put ice in a plastic bag.  Place a towel between your cast and the bag.  Leave the ice on for 20 minutes, 2-3 times a day.  If you have a plaster or fiberglass cast:  Do not try to scratch the skin under the cast with any objects. This can increase your risk of skin infection.  Check the skin around the cast every day. You may put lotion on any red or sore areas.  Keep your cast dry and clean.  If you have a plaster splint:  Wear the splint as directed.  You may loosen the elastic   around the splint if your toes become numb, tingle, or turn cold or blue.  Do not put pressure on any part of your cast or splint; it may break. Rest your cast only on a pillow the first 24 hours until it is fully hardened.  Your cast or splint can be protected during bathing with a plastic bag sealed to your skin with medical tape. Do not lower the cast or splint into water.  Take medicines as directed by your health care provider. Only take over-the-counter or prescription medicines for pain, discomfort, or fever as directed by your health care provider.  Do not drive a vehicle until  your health care provider specifically tells you it is safe to do so.  If your health care provider has given you a follow-up appointment, it is very important to keep that appointment. Not keeping the appointment could result in a chronic or permanent injury, pain, and disability. If you have any problem keeping the appointment, call the facility for assistance. SEEK MEDICAL CARE IF: You develop increased swelling or discomfort. SEEK IMMEDIATE MEDICAL CARE IF:   Your cast gets damaged or breaks.  You have continued severe pain.  You develop new pain or swelling after the cast was put on.  Your skin or toenails below the injury turn blue or gray.  Your skin or toenails below the injury feel cold, numb, or have loss of sensitivity to touch.  There is a bad smell or pus draining from under the cast. MAKE SURE YOU:   Understand these instructions.  Will watch your condition.  Will get help right away if you are not doing well or get worse.   This information is not intended to replace advice given to you by your health care provider. Make sure you discuss any questions you have with your health care provider.   Document Released: 09/28/2000 Document Revised: 10/06/2013 Document Reviewed: 04/30/2013 Elsevier Interactive Patient Education 2016 [employer]  

## 2016-01-30 ENCOUNTER — Other Ambulatory Visit: Payer: Self-pay | Admitting: Orthopaedic Surgery

## 2016-01-30 DIAGNOSIS — S8261XA Displaced fracture of lateral malleolus of right fibula, initial encounter for closed fracture: Secondary | ICD-10-CM | POA: Diagnosis not present

## 2016-01-30 NOTE — Progress Notes (Signed)
Please place surgical orders in epic. Pt has PAT appt scheduled for 01/31/2016. Thanks.

## 2016-01-31 ENCOUNTER — Encounter (HOSPITAL_COMMUNITY)
Admission: RE | Admit: 2016-01-31 | Discharge: 2016-01-31 | Disposition: A | Discharge status: Home / Self Care | Payer: Medicare Other | Source: Ambulatory Visit | Attending: Orthopaedic Surgery | Admitting: Orthopaedic Surgery

## 2016-01-31 ENCOUNTER — Encounter (HOSPITAL_COMMUNITY): Payer: Self-pay

## 2016-01-31 DIAGNOSIS — S8261XA Displaced fracture of lateral malleolus of right fibula, initial encounter for closed fracture: Secondary | ICD-10-CM | POA: Diagnosis not present

## 2016-01-31 DIAGNOSIS — Z87891 Personal history of nicotine dependence: Secondary | ICD-10-CM | POA: Diagnosis not present

## 2016-01-31 HISTORY — DX: Major depressive disorder, single episode, unspecified: F32.9

## 2016-01-31 HISTORY — DX: Unspecified acute and subacute iridocyclitis: H20.00

## 2016-01-31 HISTORY — DX: Depression, unspecified: F32.A

## 2016-01-31 HISTORY — DX: Dermatitis, unspecified: L30.9

## 2016-01-31 HISTORY — DX: Presence of spectacles and contact lenses: Z97.3

## 2016-01-31 HISTORY — DX: Hesitancy of micturition: R39.11

## 2016-01-31 HISTORY — DX: Cardiac arrhythmia, unspecified: I49.9

## 2016-01-31 HISTORY — DX: Personal history of urinary calculi: Z87.442

## 2016-01-31 HISTORY — DX: Displaced bimalleolar fracture of unspecified lower leg, initial encounter for closed fracture: S82.843A

## 2016-01-31 LAB — BASIC METABOLIC PANEL
ANION GAP: 7 (ref 5–15)
BUN: 19 mg/dL (ref 6–20)
CHLORIDE: 108 mmol/L (ref 101–111)
CO2: 28 mmol/L (ref 22–32)
Calcium: 9.2 mg/dL (ref 8.9–10.3)
Creatinine, Ser: 1.16 mg/dL (ref 0.61–1.24)
GFR calc non Af Amer: >60 mL/min (ref >60)
Glucose, Bld: 120 mg/dL — ABNORMAL HIGH (ref 65–99)
Potassium: 5.2 mmol/L — ABNORMAL HIGH (ref 3.5–5.1)
Sodium: 143 mmol/L (ref 135–145)

## 2016-01-31 LAB — CBC
HEMATOCRIT: 40.8 % (ref 39.0–52.0)
HEMOGLOBIN: 14.3 g/dL (ref 13.0–17.0)
MCH: 29.9 pg (ref 26.0–34.0)
MCHC: 35 g/dL (ref 30.0–36.0)
MCV: 85.2 fL (ref 78.0–100.0)
Platelets: 216 10*3/uL (ref 150–400)
RBC: 4.79 MIL/uL (ref 4.22–5.81)
RDW: 13.1 % (ref 11.5–15.5)
WBC: 6.6 10*3/uL (ref 4.0–10.5)

## 2016-01-31 NOTE — Patient Instructions (Signed)
Peter Hensley  01/31/2016   Your procedure is scheduled on: Friday February 03, 2016  Report to [facility] Main  Entrance take [place]  elevators to 3rd floor to  [place] at 12:00 NOON..  Call this number if you have problems the morning of surgery [phone]   Remember: ONLY 1 PERSON MAY GO WITH YOU TO SHORT STAY TO GET  READY MORNING OF YOUR SURGERY.  Do not eat food After Midnight but may take clear liquid diet till 8:00 am day of surgery then nothing by mouth.      Take these medicines the morning of surgery with A SIP OF WATER: May take Oxycodone-Acetaminophen if needed; Sertraline (Zoloft)                               You may not have any metal on your body including hair pins and              piercings  Do not wear jewelry, lotions, powders or colognes, deodorant                        Men may shave face and neck.   Do not bring valuables to the hospital. [place].  Contacts, dentures or bridgework may not be worn into surgery.  Leave suitcase in the car. After surgery it may be brought to your room.   _____________________________________________________________________             [facility] - Preparing for Surgery Before surgery, you can play an important role.  Because skin is not sterile, your skin needs to be as free of germs as possible.  You can reduce the number of germs on your skin by washing with CHG (chlorahexidine gluconate) soap before surgery.  CHG is an antiseptic cleaner which kills germs and bonds with the skin to continue killing germs even after washing. Please DO NOT use if you have an allergy to CHG or antibacterial soaps.  If your skin becomes reddened/irritated stop using the CHG and inform your nurse when you arrive at Short Stay. Do not shave (including legs and underarms) for at least 48 hours prior to the first CHG shower.  You may shave your face/neck. Please  follow these instructions carefully:  1.  Shower with CHG Soap the night before surgery and the  morning of Surgery.  2.  If you choose to wash your hair, wash your hair first as usual with your  normal  shampoo.  3.  After you shampoo, rinse your hair and body thoroughly to remove the  shampoo.                           4.  Use CHG as you would any other liquid soap.  You can apply chg directly  to the skin and wash                       Gently with a scrungie or clean washcloth.  5.  Apply the CHG Soap to your body ONLY FROM THE NECK DOWN.   Do not use on face/ open  Wound or open sores. Avoid contact with eyes, ears mouth and genitals (private parts).                       Wash face,  Genitals (private parts) with your normal soap.             6.  Wash thoroughly, paying special attention to the area where your surgery  will be performed.  7.  Thoroughly rinse your body with warm water from the neck down.  8.  DO NOT shower/wash with your normal soap after using and rinsing off  the CHG Soap.                9.  Pat yourself dry with a clean towel.            10.  Wear clean pajamas.            11.  Place clean sheets on your bed the night of your first shower and do not  sleep with pets. Day of Surgery : Do not apply any lotions/deodorants the morning of surgery.  Please wear clean clothes to the hospital/surgery center.  FAILURE TO FOLLOW THESE INSTRUCTIONS MAY RESULT IN THE CANCELLATION OF YOUR SURGERY PATIENT SIGNATURE_________________________________  NURSE SIGNATURE__________________________________  ________________________________________________________________________    CLEAR LIQUID DIET   Foods Allowed                                                                     Foods Excluded  Coffee and tea, regular and decaf                             liquids that you cannot  Plain Jell-O in any flavor                                             see  through such as: Fruit ices (not with fruit pulp)                                     milk, soups, orange juice  Iced Popsicles                                    All solid food Carbonated beverages, regular and diet                                    Cranberry, grape and apple juices Sports drinks like Gatorade Lightly seasoned clear broth or consume(fat free) Sugar, honey syrup  Sample Menu Breakfast                                Lunch  Supper Cranberry juice                    Beef broth                            Chicken broth Jell-O                                     Grape juice                           Apple juice Coffee or tea                        Jell-O                                      Popsicle                                                Coffee or tea                        Coffee or tea  _____________________________________________________________________

## 2016-02-03 ENCOUNTER — Encounter (HOSPITAL_COMMUNITY)
Admission: RE | Disposition: A | Discharge status: Home / Self Care | Payer: Self-pay | Source: Ambulatory Visit | Attending: Orthopaedic Surgery

## 2016-02-03 ENCOUNTER — Ambulatory Visit (HOSPITAL_COMMUNITY): Payer: Medicare Other | Admitting: Certified Registered"

## 2016-02-03 ENCOUNTER — Observation Stay (HOSPITAL_COMMUNITY)
Admission: RE | Admit: 2016-02-03 | Discharge: 2016-02-04 | Disposition: A | Discharge status: Home / Self Care | Payer: Medicare Other | Source: Ambulatory Visit | Attending: Orthopaedic Surgery | Admitting: Orthopaedic Surgery

## 2016-02-03 ENCOUNTER — Ambulatory Visit (HOSPITAL_COMMUNITY): Payer: Medicare Other

## 2016-02-03 ENCOUNTER — Encounter (HOSPITAL_COMMUNITY): Payer: Self-pay

## 2016-02-03 DIAGNOSIS — Z87891 Personal history of nicotine dependence: Secondary | ICD-10-CM | POA: Diagnosis not present

## 2016-02-03 DIAGNOSIS — S8261XA Displaced fracture of lateral malleolus of right fibula, initial encounter for closed fracture: Principal | ICD-10-CM

## 2016-02-03 DIAGNOSIS — T148XXA Other injury of unspecified body region, initial encounter: Secondary | ICD-10-CM

## 2016-02-03 DIAGNOSIS — S82891A Other fracture of right lower leg, initial encounter for closed fracture: Secondary | ICD-10-CM | POA: Diagnosis not present

## 2016-02-03 HISTORY — PX: ORIF ANKLE FRACTURE: SHX5408

## 2016-02-03 SURGERY — OPEN REDUCTION INTERNAL FIXATION (ORIF) ANKLE FRACTURE
Anesthesia: General | Site: Ankle | Laterality: Right

## 2016-02-03 MED ORDER — ONDANSETRON HCL 4 MG/2ML IJ SOLN
INTRAMUSCULAR | Status: AC
  Filled 2016-02-03: qty 2

## 2016-02-03 MED ORDER — ACETAMINOPHEN 325 MG PO TABS
650.0000 mg | ORAL_TABLET | Freq: Four times a day (QID) | ORAL | Status: DC | PRN
  Administered 2016-02-04: 650 mg via ORAL
  Filled 2016-02-03: qty 2

## 2016-02-03 MED ORDER — FENTANYL CITRATE (PF) 100 MCG/2ML IJ SOLN
INTRAMUSCULAR | Status: DC | PRN
  Administered 2016-02-03 (×2): 50 ug via INTRAVENOUS

## 2016-02-03 MED ORDER — SERTRALINE HCL 50 MG PO TABS
50.0000 mg | ORAL_TABLET | Freq: Every day | ORAL | Status: DC
  Administered 2016-02-04: 50 mg via ORAL
  Filled 2016-02-03: qty 1

## 2016-02-03 MED ORDER — HYDROMORPHONE HCL 1 MG/ML IJ SOLN: 1.0000 mg | INTRAMUSCULAR | Status: DC | PRN

## 2016-02-03 MED ORDER — SODIUM CHLORIDE 0.9 % IR SOLN
Status: AC
  Filled 2016-02-03: qty 1

## 2016-02-03 MED ORDER — PHENYLEPHRINE 40 MCG/ML (10ML) SYRINGE FOR IV PUSH (FOR BLOOD PRESSURE SUPPORT)
PREFILLED_SYRINGE | INTRAVENOUS | Status: AC
  Filled 2016-02-03: qty 30

## 2016-02-03 MED ORDER — BUPIVACAINE HCL (PF) 0.5 % IJ SOLN
INTRAMUSCULAR | Status: AC
  Filled 2016-02-03: qty 60

## 2016-02-03 MED ORDER — PHENYLEPHRINE HCL 10 MG/ML IJ SOLN
INTRAMUSCULAR | Status: DC | PRN
  Administered 2016-02-03 (×4): 80 ug via INTRAVENOUS

## 2016-02-03 MED ORDER — ACETAMINOPHEN 650 MG RE SUPP: 650.0000 mg | Freq: Four times a day (QID) | RECTAL | Status: DC | PRN

## 2016-02-03 MED ORDER — OXYCODONE HCL 5 MG PO TABS
5.0000 mg | ORAL_TABLET | ORAL | Status: DC | PRN
  Administered 2016-02-03: 10 mg via ORAL
  Administered 2016-02-03: 5 mg via ORAL
  Administered 2016-02-03 – 2016-02-04 (×4): 10 mg via ORAL
  Filled 2016-02-03 (×3): qty 2
  Filled 2016-02-03: qty 1
  Filled 2016-02-03 (×2): qty 2

## 2016-02-03 MED ORDER — HYDROMORPHONE HCL 1 MG/ML IJ SOLN: 0.2500 mg | INTRAMUSCULAR | Status: DC | PRN

## 2016-02-03 MED ORDER — LIDOCAINE HCL (CARDIAC) 20 MG/ML IV SOLN
INTRAVENOUS | Status: AC
  Filled 2016-02-03: qty 5

## 2016-02-03 MED ORDER — PROPOFOL 10 MG/ML IV BOLUS
INTRAVENOUS | Status: AC
  Filled 2016-02-03: qty 20

## 2016-02-03 MED ORDER — METOCLOPRAMIDE HCL 5 MG/ML IJ SOLN: 5.0000 mg | Freq: Three times a day (TID) | INTRAMUSCULAR | Status: DC | PRN

## 2016-02-03 MED ORDER — BUPIVACAINE HCL (PF) 0.5 % IJ SOLN
INTRAMUSCULAR | Status: AC
  Filled 2016-02-03: qty 30

## 2016-02-03 MED ORDER — LIDOCAINE HCL (CARDIAC) 20 MG/ML IV SOLN
INTRAVENOUS | Status: DC | PRN
  Administered 2016-02-03: 100 mg via INTRAVENOUS

## 2016-02-03 MED ORDER — LACTATED RINGERS IV SOLN
INTRAVENOUS | Status: DC | PRN
  Administered 2016-02-03 (×2): via INTRAVENOUS

## 2016-02-03 MED ORDER — ONDANSETRON HCL 4 MG/2ML IJ SOLN
INTRAMUSCULAR | Status: DC | PRN
  Administered 2016-02-03 (×2): 2 mg via INTRAVENOUS

## 2016-02-03 MED ORDER — METHOCARBAMOL 500 MG PO TABS
500.0000 mg | ORAL_TABLET | Freq: Four times a day (QID) | ORAL | Status: DC | PRN
  Administered 2016-02-03 – 2016-02-04 (×2): 500 mg via ORAL
  Filled 2016-02-03 (×2): qty 1

## 2016-02-03 MED ORDER — CEFAZOLIN SODIUM-DEXTROSE 2-4 GM/100ML-% IV SOLN
INTRAVENOUS | Status: AC
  Filled 2016-02-03: qty 100

## 2016-02-03 MED ORDER — SIMVASTATIN 40 MG PO TABS
40.0000 mg | ORAL_TABLET | Freq: Every day | ORAL | Status: DC
  Administered 2016-02-03: 40 mg via ORAL
  Filled 2016-02-03 (×2): qty 1

## 2016-02-03 MED ORDER — MIDAZOLAM HCL 2 MG/2ML IJ SOLN
INTRAMUSCULAR | Status: AC
  Filled 2016-02-03: qty 2

## 2016-02-03 MED ORDER — CEFAZOLIN SODIUM 1-5 GM-% IV SOLN
1.0000 g | Freq: Four times a day (QID) | INTRAVENOUS | Status: AC
  Administered 2016-02-03 – 2016-02-04 (×3): 1 g via INTRAVENOUS
  Filled 2016-02-03 (×4): qty 50

## 2016-02-03 MED ORDER — DEXAMETHASONE SODIUM PHOSPHATE 10 MG/ML IJ SOLN
INTRAMUSCULAR | Status: DC | PRN
  Administered 2016-02-03: 5 mg via INTRAVENOUS

## 2016-02-03 MED ORDER — KETOROLAC TROMETHAMINE 30 MG/ML IJ SOLN: 30.0000 mg | Freq: Once | INTRAMUSCULAR | Status: DC | PRN

## 2016-02-03 MED ORDER — ONDANSETRON HCL 4 MG PO TABS: 4.0000 mg | ORAL_TABLET | Freq: Four times a day (QID) | ORAL | Status: DC | PRN

## 2016-02-03 MED ORDER — LIDOCAINE-EPINEPHRINE 2 %-1:100000 IJ SOLN
INTRAMUSCULAR | Status: AC
  Filled 2016-02-03: qty 1

## 2016-02-03 MED ORDER — DIPHENHYDRAMINE HCL 12.5 MG/5ML PO ELIX: 12.5000 mg | ORAL_SOLUTION | ORAL | Status: DC | PRN

## 2016-02-03 MED ORDER — PROPOFOL 10 MG/ML IV BOLUS
INTRAVENOUS | Status: DC | PRN
  Administered 2016-02-03: 200 mg via INTRAVENOUS
  Administered 2016-02-03: 100 mg via INTRAVENOUS

## 2016-02-03 MED ORDER — EPHEDRINE SULFATE 50 MG/ML IJ SOLN
INTRAMUSCULAR | Status: AC
  Filled 2016-02-03: qty 1

## 2016-02-03 MED ORDER — FENTANYL CITRATE (PF) 100 MCG/2ML IJ SOLN
INTRAMUSCULAR | Status: AC
  Filled 2016-02-03: qty 2

## 2016-02-03 MED ORDER — MIRTAZAPINE 45 MG PO TABS
45.0000 mg | ORAL_TABLET | Freq: Every day | ORAL | Status: DC
  Administered 2016-02-03: 45 mg via ORAL
  Filled 2016-02-03 (×2): qty 1

## 2016-02-03 MED ORDER — ONDANSETRON HCL 4 MG/2ML IJ SOLN: 4.0000 mg | Freq: Four times a day (QID) | INTRAMUSCULAR | Status: DC | PRN

## 2016-02-03 MED ORDER — METHOCARBAMOL 1000 MG/10ML IJ SOLN
500.0000 mg | Freq: Four times a day (QID) | INTRAVENOUS | Status: DC | PRN
  Filled 2016-02-03: qty 5

## 2016-02-03 MED ORDER — METOCLOPRAMIDE HCL 10 MG PO TABS: 5.0000 mg | ORAL_TABLET | Freq: Three times a day (TID) | ORAL | Status: DC | PRN

## 2016-02-03 MED ORDER — ROCURONIUM BROMIDE 100 MG/10ML IV SOLN
INTRAVENOUS | Status: AC
  Filled 2016-02-03: qty 1

## 2016-02-03 MED ORDER — PHENYLEPHRINE HCL 10 MG/ML IJ SOLN
INTRAMUSCULAR | Status: AC
  Filled 2016-02-03: qty 1

## 2016-02-03 MED ORDER — SODIUM CHLORIDE 0.9 % IV SOLN
INTRAVENOUS | Status: DC
  Administered 2016-02-03: 18:00:00 via INTRAVENOUS

## 2016-02-03 MED ORDER — DEXAMETHASONE SODIUM PHOSPHATE 10 MG/ML IJ SOLN
INTRAMUSCULAR | Status: AC
  Filled 2016-02-03: qty 1

## 2016-02-03 MED ORDER — CEFAZOLIN SODIUM-DEXTROSE 2-4 GM/100ML-% IV SOLN
2.0000 g | INTRAVENOUS | Status: AC
  Administered 2016-02-03: 2 g via INTRAVENOUS
  Filled 2016-02-03: qty 100

## 2016-02-03 MED ORDER — SODIUM CHLORIDE 0.9 % IR SOLN
Status: DC | PRN
  Administered 2016-02-03: 500 mL

## 2016-02-03 SURGICAL SUPPLY — 52 items
BANDAGE ACE 4X5 VEL STRL LF (GAUZE/BANDAGES/DRESSINGS) ×1 IMPLANT
BANDAGE ACE 6X5 VEL STRL LF (GAUZE/BANDAGES/DRESSINGS) ×1 IMPLANT
BANDAGE ESMARK 6X9 LF (GAUZE/BANDAGES/DRESSINGS) IMPLANT
BNDG CMPR 9X6 STRL LF SNTH (GAUZE/BANDAGES/DRESSINGS) ×1
BNDG ESMARK 6X9 LF (GAUZE/BANDAGES/DRESSINGS) ×2
CUFF TOURN SGL QUICK 34 (TOURNIQUET CUFF) ×2
CUFF TRNQT CYL 34X4X40X1 (TOURNIQUET CUFF) ×1 IMPLANT
DECANTER SPIKE VIAL GLASS SM (MISCELLANEOUS) ×2 IMPLANT
DRAPE C-ARM 42X120 X-RAY (DRAPES) ×2 IMPLANT
DRAPE EXTREMITY T 121X128X90 (DRAPE) ×1 IMPLANT
DRAPE U-SHAPE 47X51 STRL (DRAPES) ×2 IMPLANT
DRILL 2.6X122MM WL AO SHAFT (BIT) ×1 IMPLANT
DRSG ADAPTIC 3X8 NADH LF (GAUZE/BANDAGES/DRESSINGS) ×2 IMPLANT
DRSG PAD ABDOMINAL 8X10 ST (GAUZE/BANDAGES/DRESSINGS) ×2 IMPLANT
DURAPREP 26ML APPLICATOR (WOUND CARE) ×2 IMPLANT
ELECT REM PT RETURN 9FT ADLT (ELECTROSURGICAL) ×2
ELECTRODE REM PT RTRN 9FT ADLT (ELECTROSURGICAL) ×1 IMPLANT
GAUZE SPONGE 4X4 12PLY STRL (GAUZE/BANDAGES/DRESSINGS) ×1 IMPLANT
GAUZE XEROFORM 1X8 LF (GAUZE/BANDAGES/DRESSINGS) ×1 IMPLANT
GLOVE BIO SURGEON STRL SZ7.5 (GLOVE) ×2 IMPLANT
GLOVE BIOGEL PI IND STRL 7.0 (GLOVE) IMPLANT
GLOVE BIOGEL PI IND STRL 7.5 (GLOVE) IMPLANT
GLOVE BIOGEL PI IND STRL 8 (GLOVE) ×2 IMPLANT
GLOVE BIOGEL PI INDICATOR 7.0 (GLOVE) ×1
GLOVE BIOGEL PI INDICATOR 7.5 (GLOVE) ×1
GLOVE BIOGEL PI INDICATOR 8 (GLOVE) ×1
GLOVE ECLIPSE 7.0 STRL STRAW (GLOVE) ×1 IMPLANT
GLOVE SURG SS PI 7.5 STRL IVOR (GLOVE) ×1 IMPLANT
GOWN STRL REUS W/TWL XL LVL3 (GOWN DISPOSABLE) ×5 IMPLANT
KIT BASIN OR (CUSTOM PROCEDURE TRAY) ×2 IMPLANT
NEEDLE HYPO 22GX1.5 SAFETY (NEEDLE) ×2 IMPLANT
PACK TOTAL JOINT (CUSTOM PROCEDURE TRAY) ×2 IMPLANT
PAD CAST 4YDX4 CTTN HI CHSV (CAST SUPPLIES) IMPLANT
PADDING CAST ABS 4INX4YD NS (CAST SUPPLIES) ×1
PADDING CAST ABS 6INX4YD NS (CAST SUPPLIES) ×1
PADDING CAST ABS COTTON 4X4 ST (CAST SUPPLIES) IMPLANT
PADDING CAST ABS COTTON 6X4 NS (CAST SUPPLIES) IMPLANT
PADDING CAST COTTON 4X4 STRL (CAST SUPPLIES) ×2
PLATE 6H 113MM (Plate) ×1 IMPLANT
POSITIONER SURGICAL ARM (MISCELLANEOUS) ×2 IMPLANT
SCREW BONE 14MMX3.5MM (Screw) ×3 IMPLANT
SCREW LOCK 3.5X10MM (Screw) ×1 IMPLANT
SCREW LOCKING 3.5X12 (Screw) ×3 IMPLANT
STAPLER VISISTAT 35W (STAPLE) ×1 IMPLANT
SUT ETHILON 2 0 PS N (SUTURE) ×3 IMPLANT
SUT ETHILON 4 0 PS 2 18 (SUTURE) ×4 IMPLANT
SUT VIC AB 0 CT1 36 (SUTURE) ×1 IMPLANT
SUT VIC AB 2-0 CT1 27 (SUTURE) ×2
SUT VIC AB 2-0 CT1 TAPERPNT 27 (SUTURE) ×1 IMPLANT
SYR CONTROL 10ML LL (SYRINGE) ×2 IMPLANT
TOWEL OR 17X26 10 PK STRL BLUE (TOWEL DISPOSABLE) ×4 IMPLANT
TOWEL OR NON WOVEN STRL DISP B (DISPOSABLE) ×2 IMPLANT
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
## 2016-02-03 NOTE — Transfer of Care (Signed)
Immediate Anesthesia Transfer of Care Note  Patient: Peter Hensley  Procedure(s) Performed: Procedure(s) with comments: OPEN REDUCTION INTERNAL FIXATION (ORIF) RIGHT ANKLE LATERAL MALLEOLUS FRACTURE (Right) - General with block  Patient Location: PACU  Anesthesia Type:General  Level of Consciousness:  sedated, patient cooperative and responds to stimulation  Airway & Oxygen Therapy:Patient Spontanous Breathing and Patient connected to face mask oxgen  Post-op Assessment:  Report given to PACU RN and Post -op Vital signs reviewed and stable  Post vital signs:  Reviewed and stable  Last Vitals:  Filed Vitals:   02/03/16 1112  BP: 125/87  Pulse: 90  Temp: 36.4 C  Resp: 18    Complications: No apparent anesthesia complications
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
## 2016-02-03 NOTE — Anesthesia Procedure Notes (Signed)
Procedure Name: LMA Insertion Date/Time: 02/03/2016 2:23 PM Performed by: Freddie Breech Pre-anesthesia Checklist: Patient identified, Emergency Drugs available, Suction available, Patient being monitored and Timeout performed Patient Re-evaluated:Patient Re-evaluated prior to inductionOxygen Delivery Method: Circle system utilized Preoxygenation: Pre-oxygenation with 100% oxygen Intubation Type: IV induction Ventilation: Mask ventilation without difficulty and Oral airway inserted - appropriate to patient size LMA: LMA inserted LMA Size: 4.0 Number of attempts: 2 (While placing LMA with first attempt pt started coughing.  Additional propofol administered IV and pt masked with Sevo. ) Placement Confirmation: positive ETCO2,  CO2 detector and breath sounds checked- equal and bilateral Tube secured with: Tape Dental Injury: Teeth and Oropharynx as per pre-operative assessment

## 2016-02-03 NOTE — Anesthesia Preprocedure Evaluation (Signed)
Anesthesia Evaluation  Patient identified by MRN, date of birth, ID band Patient awake    Reviewed: Allergy & Precautions, NPO status , Patient's Chart, lab work & pertinent test results  Airway Mallampati: II  TM Distance: >3 FB Neck ROM: Full    Dental no notable dental hx.    Pulmonary neg pulmonary ROS, former smoker,    Pulmonary exam normal breath sounds clear to auscultation       Cardiovascular negative cardio ROS Normal cardiovascular exam Rhythm:Regular Rate:Normal     Neuro/Psych Depression negative neurological ROS     GI/Hepatic negative GI ROS, Neg liver ROS,   Endo/Other  negative endocrine ROS  Renal/GU negative Renal ROS  negative genitourinary   Musculoskeletal negative musculoskeletal ROS (+)   Abdominal   Peds negative pediatric ROS (+)  Hematology negative hematology ROS (+)   Anesthesia Other Findings   Reproductive/Obstetrics negative OB ROS                             Anesthesia Physical Anesthesia Plan  ASA: II  Anesthesia Plan: General   Post-op Pain Management: GA combined w/ Regional for post-op pain   Induction: Intravenous  Airway Management Planned: LMA  Additional Equipment:   Intra-op Plan:   Post-operative Plan: Extubation in OR  Informed Consent: I have reviewed the patients History and Physical, chart, labs and discussed the procedure including the risks, benefits and alternatives for the proposed anesthesia with the patient or authorized representative who has indicated his/her understanding and acceptance.   Dental advisory given  Plan Discussed with: CRNA and Surgeon  Anesthesia Plan Comments:         Anesthesia Quick Evaluation

## 2016-02-03 NOTE — Anesthesia Postprocedure Evaluation (Signed)
Anesthesia Post Note  Patient: Lamontae Glancy Wilbon  Procedure(s) Performed: Procedure(s) (LRB): OPEN REDUCTION INTERNAL FIXATION (ORIF) RIGHT ANKLE LATERAL MALLEOLUS FRACTURE (Right)  Patient location during evaluation: PACU Anesthesia Type: General and Regional Level of consciousness: awake and alert Pain management: pain level controlled Vital Signs Assessment: post-procedure vital signs reviewed and stable Respiratory status: spontaneous breathing, nonlabored ventilation, respiratory function stable and patient connected to nasal cannula oxygen Cardiovascular status: blood pressure returned to baseline and stable Postop Assessment: no signs of nausea or vomiting Anesthetic complications: no    Last Vitals:  Filed Vitals:   02/03/16 1607 02/03/16 1717  BP: 148/81 152/76  Pulse: 74 96  Temp: 36.6 C 36.5 C  Resp: 12 13    Last Pain:  Filed Vitals:   02/03/16 1717  PainSc: 0-No pain                 Ashli Selders S

## 2016-02-03 NOTE — Brief Op Note (Signed)
02/03/2016  3:06 PM  PATIENT:  Peter Hensley  65 y.o. male  PRE-OPERATIVE DIAGNOSIS:  right unstable ankle fracture  POST-OPERATIVE DIAGNOSIS:  right unstable ankle fracture  PROCEDURE:  Procedure(s) with comments: OPEN REDUCTION INTERNAL FIXATION (ORIF) RIGHT ANKLE LATERAL MALLEOLUS FRACTURE (Right) - General with block  SURGEON:  Surgeon(s) and Role:    * Mcarthur Rossetti, MD - Primary  ANESTHESIA:   regional and general  EBL:  Total I/O In: 1000 [I.V.:1000] Out: -   COUNTS:  YES  TOURNIQUET:   Total Tourniquet Time Documented: Thigh (Right) - 47 minutes Total: Thigh (Right) - 47 minutes   DICTATION: .Other Dictation: Dictation Number [id number]  PLAN OF CARE: Admit for overnight observation  PATIENT DISPOSITION:  PACU - hemodynamically stable.   Delay start of Pharmacological VTE agent (>24hrs) due to surgical blood loss or risk of bleeding: no

## 2016-02-04 DIAGNOSIS — S8261XA Displaced fracture of lateral malleolus of right fibula, initial encounter for closed fracture: Secondary | ICD-10-CM | POA: Diagnosis not present

## 2016-02-04 MED ORDER — OXYCODONE HCL 5 MG PO TABS: 5.0000 mg | ORAL_TABLET | ORAL | Status: DC | PRN

## 2016-02-04 MED ORDER — METHOCARBAMOL 500 MG PO TABS: 500.0000 mg | ORAL_TABLET | Freq: Four times a day (QID) | ORAL | Status: DC | PRN

## 2016-02-04 MED ORDER — ASPIRIN EC 325 MG PO TBEC: 325.0000 mg | DELAYED_RELEASE_TABLET | Freq: Every day | ORAL | Status: DC

## 2016-02-04 NOTE — Progress Notes (Signed)
Discharge instructions was reviewed with patient utilizing the teach back method. Patient to be discharged to home

## 2016-02-04 NOTE — Evaluation (Signed)
Physical Therapy Evaluation Patient Details Name: Peter Hensley MRN: [id number] DOB: 11-Jul-1951 Today's Date: 02/04/2016   History of Present Illness  R ankle fx, s/p ORIF  Clinical Impression  Pt is independent with mobility using knee scooter. He ambulated 300' with knee scooter, negotiated turns well. Instructed pt in R hip/knee strengthening exercises for HEP. REady to DC home from PT standpoint.     Follow Up Recommendations No PT follow up    Equipment Recommendations  Other (comment) (knee scooter)    Recommendations for Other Services       Precautions / Restrictions Precautions Precautions: Fall Restrictions Weight Bearing Restrictions: Yes RLE Weight Bearing: Non weight bearing      Mobility  Bed Mobility Overal bed mobility: Independent                Transfers Overall transfer level: Independent Equipment used: None                Ambulation/Gait Ambulation/Gait assistance: Modified independent (Device/Increase time) Ambulation Distance (Feet): 300 Feet Assistive device:  (knee scooter) Gait Pattern/deviations: Step-to pattern   Gait velocity interpretation: at or above normal speed for age/gender General Gait Details: able to negotiate obstacles and turns with knee scooter, no LOB  Stairs Stairs:  (pt declined stair training, stated he's independent with crutches on stairs)          Wheelchair Mobility    Modified Rankin (Stroke Patients Only)       Balance Overall balance assessment: Modified Independent                                           Pertinent Vitals/Pain Pain Assessment: 0-10 Pain Score: 6  Pain Location: R ankle Pain Descriptors / Indicators: Sore Pain Intervention(s): Monitored during session;Premedicated before session;Ice applied;Repositioned;Limited activity within patient's tolerance    Home Living Family/patient expects to be discharged to:: Private residence Living  Arrangements: Spouse/significant other Available Help at Discharge: Family;Available PRN/intermittently   Home Access: Stairs to enter Entrance Stairs-Rails: Right;Left Entrance Stairs-Number of Steps: 3 Home Layout: One level Home Equipment: Crutches      Prior Function Level of Independence: Independent         Comments: has used crutches for past week     Hand Dominance        Extremity/Trunk Assessment   Upper Extremity Assessment: Overall WFL for tasks assessed           Lower Extremity Assessment: LLE deficits/detail   LLE Deficits / Details: L ankle splinted, knee/hip WNL  Cervical / Trunk Assessment: Normal  Communication   Communication: No difficulties  Cognition Arousal/Alertness: Awake/alert Behavior During Therapy: WFL for tasks assessed/performed Overall Cognitive Status: Within Functional Limits for tasks assessed                      General Comments      Exercises General Exercises - Lower Extremity Quad Sets:  (instructed pt in quad sets, glut sets, LAQ, standing hip ABD, ext, flexion, handout issued)      Assessment/Plan    PT Assessment Patent does not need any further PT services  PT Diagnosis Difficulty walking   PT Problem List    PT Treatment Interventions     PT Goals (Current goals can be found in the Care Plan section) Acute Rehab PT Goals Patient Stated Goal: pt likes  to hunt deer PT Goal Formulation: All assessment and education complete, DC therapy    Frequency     Barriers to discharge        Co-evaluation               End of Session Equipment Utilized During Treatment: Gait belt Activity Tolerance: Patient tolerated treatment well Patient left: in chair;with call bell/phone within reach Nurse Communication: Mobility status    Functional Assessment Tool Used: clinical judgement Functional Limitation: Mobility: Walking and moving around Mobility: Walking and Moving Around Current Status  [id number]): At least 1 percent but less than 20 percent impaired, limited or restricted Mobility: Walking and Moving Around Goal Status [phone]): At least 1 percent but less than 20 percent impaired, limited or restricted Mobility: Walking and Moving Around Discharge Status [phone]): At least 1 percent but less than 20 percent impaired, limited or restricted    Time: 0907-0931 PT Time Calculation (min) (ACUTE ONLY): 24 min   Charges:   PT Evaluation $PT Eval Low Complexity: 1 Procedure PT Treatments $Gait Training: 8-22 mins   PT G Codes:   PT G-Codes **NOT FOR INPATIENT CLASS** Functional Assessment Tool Used: clinical judgement Functional Limitation: Mobility: Walking and moving around Mobility: Walking and Moving Around Current Status [id number]): At least 1 percent but less than 20 percent impaired, limited or restricted Mobility: Walking and Moving Around Goal Status [phone]): At least 1 percent but less than 20 percent impaired, limited or restricted Mobility: Walking and Moving Around Discharge Status [phone]): At least 1 percent but less than 20 percent impaired, limited or restricted    Philomena Doheny 02/04/2016, 9:37 AM [phone]

## 2016-02-04 NOTE — Care Management Note (Signed)
Case Management Note  Patient Details  Name: TYRENCE FANTASIA MRN: [id number] Date of Birth: Apr 04, 1951  Subjective/Objective:            R ankle fx, s/p ORIF        Action/Plan: Discharge Planning: AVS reviewed:  NCM spoke to pt and states he goes to [facility] and he receives DME free through [place]. Explained RX is needed. Contacted attending on call for order for Knee Scooter to fax to [facility]. Provided pt with brochure for [place] to pick up Knee Scooter, he can rent until he receives DME from [place]. Spoke to [place], [place] Fax # [phone].  Expected Discharge Date:  02/04/16               Expected Discharge Plan:  Home/Self Care  In-House Referral:  NA  Discharge planning Services  NA  Post Acute Care Choice:  NA Choice offered to:  NA  DME Arranged:  N/A DME Agency:  NA  HH Arranged:  NA HH Agency:  NA  Status of Service:  Completed, signed off  Medicare Important Message Given:    Date Medicare IM Given:    Medicare IM give by:    Date Additional Medicare IM Given:    Additional Medicare Important Message give by:     If discussed at [place] of Stay Meetings, dates discussed:    Additional Comments:  Erenest Rasher, RN 02/04/2016, 10:34 AM

## 2016-02-04 NOTE — Care Management Obs Status (Signed)
[place] NOTIFICATION   Patient Details  Name: Peter Hensley MRN: [id number] Date of Birth: Feb 21, 1951   Medicare Observation Status Notification Given:  Yes    Erenest Rasher, RN 02/04/2016, 10:46 AM
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
## 2016-02-06 NOTE — Progress Notes (Signed)
Faxed signed Knee Scooter orders to [facility], [place] and pt PCP is Dr. Benito Mccreedy, fax [phone]. Jonnie Finner RN CCM Case Mgmt phone [phone]
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
## 2016-02-20 DIAGNOSIS — S8261XD Displaced fracture of lateral malleolus of right fibula, subsequent encounter for closed fracture with routine healing: Secondary | ICD-10-CM | POA: Diagnosis not present

## 2016-03-19 DIAGNOSIS — S8261XD Displaced fracture of lateral malleolus of right fibula, subsequent encounter for closed fracture with routine healing: Secondary | ICD-10-CM | POA: Diagnosis not present

## 2016-04-23 DIAGNOSIS — S8261XD Displaced fracture of lateral malleolus of right fibula, subsequent encounter for closed fracture with routine healing: Secondary | ICD-10-CM | POA: Diagnosis not present

## 2016-05-31 ENCOUNTER — Telehealth: Payer: Self-pay | Admitting: Cardiology

## 2016-05-31 NOTE — Telephone Encounter (Signed)
Records rec From Dept Of [facility]. Patient has NP appointment with Dr.Skains 06/28/16-records placed in chart prep bin.

## 2016-06-28 ENCOUNTER — Ambulatory Visit (INDEPENDENT_AMBULATORY_CARE_PROVIDER_SITE_OTHER): Payer: Medicare Other | Admitting: Cardiology

## 2016-06-28 ENCOUNTER — Encounter (INDEPENDENT_AMBULATORY_CARE_PROVIDER_SITE_OTHER): Payer: Self-pay

## 2016-06-28 ENCOUNTER — Encounter: Payer: Self-pay | Admitting: Cardiology

## 2016-06-28 VITALS — BP 132/74 | HR 76 | Ht 69.0 in | Wt 186.2 lb

## 2016-06-28 DIAGNOSIS — E785 Hyperlipidemia, unspecified: Secondary | ICD-10-CM | POA: Diagnosis not present

## 2016-06-28 DIAGNOSIS — I493 Ventricular premature depolarization: Secondary | ICD-10-CM | POA: Diagnosis not present

## 2016-06-28 DIAGNOSIS — R0600 Dyspnea, unspecified: Secondary | ICD-10-CM

## 2016-06-28 MED ORDER — ASPIRIN EC 81 MG PO TBEC: 81.0000 mg | DELAYED_RELEASE_TABLET | Freq: Every day | ORAL | Status: DC

## 2016-06-28 NOTE — Patient Instructions (Addendum)
Medication Instructions:  You may decrease your ASA to 81 mg a day. Continue all other medications as listed.  Testing/Procedures: Your physician has requested that you have an exercise tolerance test. For further information please visit [email or web address]. Please also follow instruction sheet, as given.  Follow-Up: Follow up as needed with Dr Marlou Porch after testing.  Thank you for choosing [facility]!!
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
## 2016-07-04 ENCOUNTER — Ambulatory Visit (INDEPENDENT_AMBULATORY_CARE_PROVIDER_SITE_OTHER): Payer: Medicare Other

## 2016-07-04 DIAGNOSIS — R0600 Dyspnea, unspecified: Secondary | ICD-10-CM | POA: Diagnosis not present

## 2016-07-04 LAB — EXERCISE TOLERANCE TEST
CHL CUP RESTING HR STRESS: 72 {beats}/min
CSEPED: 11 min
CSEPEDS: 0 s
CSEPEW: 13.4 METS
CSEPHR: 93 %
CSEPPHR: 144 {beats}/min
MPHR: 154 {beats}/min
RPE: 16

## 2016-07-11 ENCOUNTER — Encounter: Payer: Self-pay | Admitting: *Deleted

## 2017-01-03 DIAGNOSIS — F900 Attention-deficit hyperactivity disorder, predominantly inattentive type: Secondary | ICD-10-CM | POA: Diagnosis not present

## 2017-01-03 DIAGNOSIS — N529 Male erectile dysfunction, unspecified: Secondary | ICD-10-CM | POA: Diagnosis not present
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
## 2019-06-09 DIAGNOSIS — Z0001 Encounter for general adult medical examination with abnormal findings: Secondary | ICD-10-CM | POA: Diagnosis not present

## 2019-06-09 DIAGNOSIS — F4312 Post-traumatic stress disorder, chronic: Secondary | ICD-10-CM | POA: Diagnosis not present

## 2019-06-09 DIAGNOSIS — I493 Ventricular premature depolarization: Secondary | ICD-10-CM | POA: Diagnosis not present

## 2019-06-09 DIAGNOSIS — Z23 Encounter for immunization: Secondary | ICD-10-CM | POA: Diagnosis not present

## 2019-06-24 DIAGNOSIS — H35033 Hypertensive retinopathy, bilateral: Secondary | ICD-10-CM | POA: Diagnosis not present

## 2019-06-24 DIAGNOSIS — H2513 Age-related nuclear cataract, bilateral: Secondary | ICD-10-CM | POA: Diagnosis not present

## 2019-06-24 DIAGNOSIS — H524 Presbyopia: Secondary | ICD-10-CM | POA: Diagnosis not present

## 2019-06-24 DIAGNOSIS — H04123 Dry eye syndrome of bilateral lacrimal glands: Secondary | ICD-10-CM | POA: Diagnosis not present

## 2019-07-31 DIAGNOSIS — H02883 Meibomian gland dysfunction of right eye, unspecified eyelid: Secondary | ICD-10-CM | POA: Diagnosis not present

## 2019-07-31 DIAGNOSIS — H10432 Chronic follicular conjunctivitis, left eye: Secondary | ICD-10-CM | POA: Diagnosis not present

## 2019-07-31 DIAGNOSIS — H02886 Meibomian gland dysfunction of left eye, unspecified eyelid: Secondary | ICD-10-CM | POA: Diagnosis not present

## 2019-08-25 ENCOUNTER — Ambulatory Visit: Payer: Medicare Other | Admitting: Cardiology

## 2019-09-07 DIAGNOSIS — H02846 Edema of left eye, unspecified eyelid: Secondary | ICD-10-CM | POA: Diagnosis not present

## 2019-09-24 ENCOUNTER — Other Ambulatory Visit: Payer: Self-pay

## 2019-09-24 ENCOUNTER — Encounter: Payer: Self-pay | Admitting: Cardiology

## 2019-09-24 ENCOUNTER — Ambulatory Visit: Payer: Medicare Other | Admitting: Cardiology

## 2019-09-24 VITALS — BP 120/80 | HR 59 | Ht 69.0 in | Wt 190.0 lb

## 2019-09-24 DIAGNOSIS — E785 Hyperlipidemia, unspecified: Secondary | ICD-10-CM | POA: Diagnosis not present

## 2019-09-24 DIAGNOSIS — I493 Ventricular premature depolarization: Secondary | ICD-10-CM | POA: Diagnosis not present
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
## 2019-09-24 NOTE — Patient Instructions (Signed)
Medication Instructions:  The current medical regimen is effective;  continue present plan and medications.  *If you need a refill on your cardiac medications before your next appointment, please call your pharmacy*  Follow-Up: At [facility], you and your health needs are our priority.  As part of our continuing mission to provide you with exceptional heart care, we have created designated Provider Care Teams.  These Care Teams include your primary Cardiologist (physician) and Advanced Practice Providers (APPs -  Physician Assistants and Nurse Practitioners) who all work together to provide you with the care you need, when you need it.  Your next appointment:   2 year(s)  The format for your next appointment:   In Person  Provider:   Candee Furbish, MD  Thank you for choosing [facility]!!

## 2019-12-08 DIAGNOSIS — L988 Other specified disorders of the skin and subcutaneous tissue: Secondary | ICD-10-CM | POA: Diagnosis not present

## 2019-12-08 DIAGNOSIS — Q909 Down syndrome, unspecified: Secondary | ICD-10-CM | POA: Diagnosis not present

## 2019-12-08 DIAGNOSIS — Z87891 Personal history of nicotine dependence: Secondary | ICD-10-CM | POA: Diagnosis not present

## 2019-12-08 DIAGNOSIS — C9480 Other specified leukemias not having achieved remission: Secondary | ICD-10-CM | POA: Diagnosis not present

## 2019-12-08 DIAGNOSIS — H532 Diplopia: Secondary | ICD-10-CM | POA: Diagnosis not present

## 2019-12-08 DIAGNOSIS — C9201 Acute myeloblastic leukemia, in remission: Secondary | ICD-10-CM | POA: Diagnosis not present

## 2019-12-08 DIAGNOSIS — H4321 Crystalline deposits in vitreous body, right eye: Secondary | ICD-10-CM | POA: Diagnosis not present

## 2019-12-08 DIAGNOSIS — Z9889 Other specified postprocedural states: Secondary | ICD-10-CM | POA: Diagnosis not present

## 2019-12-08 DIAGNOSIS — H35371 Puckering of macula, right eye: Secondary | ICD-10-CM | POA: Diagnosis not present

## 2019-12-08 DIAGNOSIS — C9200 Acute myeloblastic leukemia, not having achieved remission: Secondary | ICD-10-CM | POA: Diagnosis not present

## 2019-12-15 DIAGNOSIS — C9480 Other specified leukemias not having achieved remission: Secondary | ICD-10-CM | POA: Diagnosis not present

## 2019-12-15 DIAGNOSIS — E785 Hyperlipidemia, unspecified: Secondary | ICD-10-CM | POA: Diagnosis not present

## 2019-12-15 DIAGNOSIS — R945 Abnormal results of liver function studies: Secondary | ICD-10-CM | POA: Diagnosis not present

## 2019-12-15 DIAGNOSIS — F4310 Post-traumatic stress disorder, unspecified: Secondary | ICD-10-CM | POA: Diagnosis not present

## 2019-12-17 DIAGNOSIS — C9480 Other specified leukemias not having achieved remission: Secondary | ICD-10-CM | POA: Diagnosis not present

## 2019-12-17 DIAGNOSIS — K869 Disease of pancreas, unspecified: Secondary | ICD-10-CM | POA: Diagnosis not present

## 2019-12-17 DIAGNOSIS — L988 Other specified disorders of the skin and subcutaneous tissue: Secondary | ICD-10-CM | POA: Diagnosis not present

## 2019-12-24 DIAGNOSIS — C9480 Other specified leukemias not having achieved remission: Secondary | ICD-10-CM | POA: Diagnosis not present

## 2019-12-24 DIAGNOSIS — Z79899 Other long term (current) drug therapy: Secondary | ICD-10-CM | POA: Diagnosis not present

## 2020-05-31 DIAGNOSIS — Z01818 Encounter for other preprocedural examination: Secondary | ICD-10-CM | POA: Diagnosis not present

## 2020-06-01 DIAGNOSIS — C9230 Myeloid sarcoma, not having achieved remission: Secondary | ICD-10-CM | POA: Diagnosis not present

## 2020-06-01 DIAGNOSIS — L988 Other specified disorders of the skin and subcutaneous tissue: Secondary | ICD-10-CM | POA: Diagnosis not present

## 2020-06-01 DIAGNOSIS — C9480 Other specified leukemias not having achieved remission: Secondary | ICD-10-CM | POA: Diagnosis not present

## 2020-06-08 DIAGNOSIS — C9480 Other specified leukemias not having achieved remission: Secondary | ICD-10-CM | POA: Diagnosis not present

## 2020-06-08 DIAGNOSIS — Z01818 Encounter for other preprocedural examination: Secondary | ICD-10-CM | POA: Diagnosis not present

## 2020-06-09 DIAGNOSIS — C9480 Other specified leukemias not having achieved remission: Secondary | ICD-10-CM | POA: Diagnosis not present

## 2020-06-09 DIAGNOSIS — D696 Thrombocytopenia, unspecified: Secondary | ICD-10-CM | POA: Diagnosis not present

## 2020-06-09 DIAGNOSIS — Z79899 Other long term (current) drug therapy: Secondary | ICD-10-CM | POA: Diagnosis not present

## 2020-06-09 DIAGNOSIS — Z01818 Encounter for other preprocedural examination: Secondary | ICD-10-CM | POA: Diagnosis not present

## 2020-06-09 DIAGNOSIS — C9230 Myeloid sarcoma, not having achieved remission: Secondary | ICD-10-CM | POA: Diagnosis not present

## 2020-06-10 DIAGNOSIS — C9230 Myeloid sarcoma, not having achieved remission: Secondary | ICD-10-CM | POA: Diagnosis not present

## 2020-06-10 DIAGNOSIS — L988 Other specified disorders of the skin and subcutaneous tissue: Secondary | ICD-10-CM | POA: Diagnosis not present

## 2020-06-10 DIAGNOSIS — C9480 Other specified leukemias not having achieved remission: Secondary | ICD-10-CM | POA: Diagnosis not present

## 2020-06-14 DIAGNOSIS — C9230 Myeloid sarcoma, not having achieved remission: Secondary | ICD-10-CM | POA: Diagnosis not present

## 2020-06-17 DIAGNOSIS — C9230 Myeloid sarcoma, not having achieved remission: Secondary | ICD-10-CM | POA: Diagnosis not present

## 2020-06-21 DIAGNOSIS — C9230 Myeloid sarcoma, not having achieved remission: Secondary | ICD-10-CM | POA: Diagnosis not present

## 2020-06-21 DIAGNOSIS — Z7682 Awaiting organ transplant status: Secondary | ICD-10-CM | POA: Diagnosis not present

## 2020-06-22 DIAGNOSIS — K838 Other specified diseases of biliary tract: Secondary | ICD-10-CM | POA: Diagnosis not present

## 2020-06-22 DIAGNOSIS — K8051 Calculus of bile duct without cholangitis or cholecystitis with obstruction: Secondary | ICD-10-CM | POA: Diagnosis not present

## 2020-06-22 DIAGNOSIS — Z4689 Encounter for fitting and adjustment of other specified devices: Secondary | ICD-10-CM | POA: Diagnosis not present

## 2020-06-22 DIAGNOSIS — Z9889 Other specified postprocedural states: Secondary | ICD-10-CM | POA: Diagnosis not present

## 2020-06-22 DIAGNOSIS — F4310 Post-traumatic stress disorder, unspecified: Secondary | ICD-10-CM | POA: Diagnosis not present

## 2020-06-24 DIAGNOSIS — C9200 Acute myeloblastic leukemia, not having achieved remission: Secondary | ICD-10-CM | POA: Diagnosis not present

## 2020-06-24 DIAGNOSIS — F4310 Post-traumatic stress disorder, unspecified: Secondary | ICD-10-CM | POA: Diagnosis not present

## 2020-06-24 DIAGNOSIS — C9230 Myeloid sarcoma, not having achieved remission: Secondary | ICD-10-CM | POA: Diagnosis not present

## 2020-06-24 DIAGNOSIS — I10 Essential (primary) hypertension: Secondary | ICD-10-CM | POA: Diagnosis not present

## 2020-06-24 DIAGNOSIS — Z79899 Other long term (current) drug therapy: Secondary | ICD-10-CM | POA: Diagnosis not present

## 2020-06-24 DIAGNOSIS — E876 Hypokalemia: Secondary | ICD-10-CM | POA: Diagnosis not present

## 2020-06-24 DIAGNOSIS — E785 Hyperlipidemia, unspecified: Secondary | ICD-10-CM | POA: Diagnosis not present

## 2020-06-24 DIAGNOSIS — B952 Enterococcus as the cause of diseases classified elsewhere: Secondary | ICD-10-CM | POA: Diagnosis not present

## 2020-07-12 DIAGNOSIS — I10 Essential (primary) hypertension: Secondary | ICD-10-CM | POA: Diagnosis not present

## 2020-07-12 DIAGNOSIS — I348 Other nonrheumatic mitral valve disorders: Secondary | ICD-10-CM | POA: Diagnosis not present

## 2020-07-12 DIAGNOSIS — R059 Cough, unspecified: Secondary | ICD-10-CM | POA: Diagnosis not present

## 2020-07-12 DIAGNOSIS — C9201 Acute myeloblastic leukemia, in remission: Secondary | ICD-10-CM | POA: Diagnosis not present

## 2020-07-12 DIAGNOSIS — T451X5A Adverse effect of antineoplastic and immunosuppressive drugs, initial encounter: Secondary | ICD-10-CM | POA: Diagnosis not present

## 2020-07-12 DIAGNOSIS — R918 Other nonspecific abnormal finding of lung field: Secondary | ICD-10-CM | POA: Diagnosis not present

## 2020-07-12 DIAGNOSIS — Z87891 Personal history of nicotine dependence: Secondary | ICD-10-CM | POA: Diagnosis not present

## 2020-07-12 DIAGNOSIS — I959 Hypotension, unspecified: Secondary | ICD-10-CM | POA: Diagnosis not present

## 2020-07-12 DIAGNOSIS — B372 Candidiasis of skin and nail: Secondary | ICD-10-CM | POA: Diagnosis not present

## 2020-07-12 DIAGNOSIS — E876 Hypokalemia: Secondary | ICD-10-CM | POA: Diagnosis not present

## 2020-07-12 DIAGNOSIS — C9230 Myeloid sarcoma, not having achieved remission: Secondary | ICD-10-CM | POA: Diagnosis not present

## 2020-07-12 DIAGNOSIS — D709 Neutropenia, unspecified: Secondary | ICD-10-CM | POA: Diagnosis not present

## 2020-07-12 DIAGNOSIS — J984 Other disorders of lung: Secondary | ICD-10-CM | POA: Diagnosis not present

## 2020-07-12 DIAGNOSIS — R5081 Fever presenting with conditions classified elsewhere: Secondary | ICD-10-CM | POA: Diagnosis not present

## 2020-07-12 DIAGNOSIS — H6120 Impacted cerumen, unspecified ear: Secondary | ICD-10-CM | POA: Diagnosis not present

## 2020-07-12 DIAGNOSIS — R509 Fever, unspecified: Secondary | ICD-10-CM | POA: Diagnosis not present

## 2020-07-12 DIAGNOSIS — K219 Gastro-esophageal reflux disease without esophagitis: Secondary | ICD-10-CM | POA: Diagnosis not present

## 2020-07-12 DIAGNOSIS — F4312 Post-traumatic stress disorder, chronic: Secondary | ICD-10-CM | POA: Diagnosis not present

## 2020-07-12 DIAGNOSIS — C9200 Acute myeloblastic leukemia, not having achieved remission: Secondary | ICD-10-CM | POA: Diagnosis not present

## 2020-07-12 DIAGNOSIS — Z9484 Stem cells transplant status: Secondary | ICD-10-CM | POA: Diagnosis not present

## 2020-07-12 DIAGNOSIS — R0902 Hypoxemia: Secondary | ICD-10-CM | POA: Diagnosis not present

## 2020-07-12 DIAGNOSIS — F331 Major depressive disorder, recurrent, moderate: Secondary | ICD-10-CM | POA: Diagnosis not present

## 2020-07-12 DIAGNOSIS — D61810 Antineoplastic chemotherapy induced pancytopenia: Secondary | ICD-10-CM | POA: Diagnosis not present

## 2020-07-12 DIAGNOSIS — R0609 Other forms of dyspnea: Secondary | ICD-10-CM | POA: Diagnosis not present

## 2020-07-12 DIAGNOSIS — K1231 Oral mucositis (ulcerative) due to antineoplastic therapy: Secondary | ICD-10-CM | POA: Diagnosis not present

## 2020-07-12 DIAGNOSIS — E8770 Fluid overload, unspecified: Secondary | ICD-10-CM | POA: Diagnosis not present

## 2020-07-12 DIAGNOSIS — Z006 Encounter for examination for normal comparison and control in clinical research program: Secondary | ICD-10-CM | POA: Diagnosis not present

## 2020-07-12 DIAGNOSIS — F329 Major depressive disorder, single episode, unspecified: Secondary | ICD-10-CM | POA: Diagnosis not present

## 2020-07-12 DIAGNOSIS — J90 Pleural effusion, not elsewhere classified: Secondary | ICD-10-CM | POA: Diagnosis not present

## 2020-08-19 DIAGNOSIS — C9201 Acute myeloblastic leukemia, in remission: Secondary | ICD-10-CM | POA: Diagnosis not present

## 2020-08-19 DIAGNOSIS — Z9481 Bone marrow transplant status: Secondary | ICD-10-CM | POA: Diagnosis not present

## 2020-12-07 DIAGNOSIS — Z23 Encounter for immunization: Secondary | ICD-10-CM | POA: Diagnosis not present

## 2020-12-29 DIAGNOSIS — K831 Obstruction of bile duct: Secondary | ICD-10-CM | POA: Diagnosis not present

## 2020-12-29 DIAGNOSIS — K819 Cholecystitis, unspecified: Secondary | ICD-10-CM | POA: Diagnosis not present

## 2021-01-09 DIAGNOSIS — K831 Obstruction of bile duct: Secondary | ICD-10-CM | POA: Diagnosis not present

## 2021-01-09 DIAGNOSIS — K819 Cholecystitis, unspecified: Secondary | ICD-10-CM | POA: Diagnosis not present

## 2021-01-09 DIAGNOSIS — C9201 Acute myeloblastic leukemia, in remission: Secondary | ICD-10-CM | POA: Diagnosis not present

## 2021-01-12 DIAGNOSIS — K819 Cholecystitis, unspecified: Secondary | ICD-10-CM | POA: Diagnosis not present

## 2021-01-12 DIAGNOSIS — K831 Obstruction of bile duct: Secondary | ICD-10-CM | POA: Diagnosis not present

## 2021-01-17 DIAGNOSIS — C9201 Acute myeloblastic leukemia, in remission: Secondary | ICD-10-CM | POA: Diagnosis not present

## 2021-01-17 DIAGNOSIS — Z9481 Bone marrow transplant status: Secondary | ICD-10-CM | POA: Diagnosis not present

## 2021-01-24 DIAGNOSIS — Z23 Encounter for immunization: Secondary | ICD-10-CM | POA: Diagnosis not present

## 2021-02-13 DIAGNOSIS — Z9481 Bone marrow transplant status: Secondary | ICD-10-CM | POA: Diagnosis not present

## 2021-02-13 DIAGNOSIS — C9201 Acute myeloblastic leukemia, in remission: Secondary | ICD-10-CM | POA: Diagnosis not present

## 2021-02-13 DIAGNOSIS — Z9221 Personal history of antineoplastic chemotherapy: Secondary | ICD-10-CM | POA: Diagnosis not present

## 2021-02-13 DIAGNOSIS — Z9484 Stem cells transplant status: Secondary | ICD-10-CM | POA: Diagnosis not present

## 2021-02-27 DIAGNOSIS — Z9484 Stem cells transplant status: Secondary | ICD-10-CM | POA: Diagnosis not present

## 2021-02-27 DIAGNOSIS — C9201 Acute myeloblastic leukemia, in remission: Secondary | ICD-10-CM | POA: Diagnosis not present

## 2021-02-27 DIAGNOSIS — D89813 Graft-versus-host disease, unspecified: Secondary | ICD-10-CM | POA: Diagnosis not present

## 2021-02-27 DIAGNOSIS — R942 Abnormal results of pulmonary function studies: Secondary | ICD-10-CM | POA: Diagnosis not present

## 2021-03-02 DIAGNOSIS — Z9484 Stem cells transplant status: Secondary | ICD-10-CM | POA: Diagnosis not present

## 2021-03-08 DIAGNOSIS — K831 Obstruction of bile duct: Secondary | ICD-10-CM | POA: Diagnosis not present

## 2021-03-08 DIAGNOSIS — Z4659 Encounter for fitting and adjustment of other gastrointestinal appliance and device: Secondary | ICD-10-CM | POA: Diagnosis not present

## 2021-03-08 DIAGNOSIS — T85510A Breakdown (mechanical) of bile duct prosthesis, initial encounter: Secondary | ICD-10-CM | POA: Diagnosis not present

## 2021-03-10 DIAGNOSIS — K219 Gastro-esophageal reflux disease without esophagitis: Secondary | ICD-10-CM | POA: Diagnosis not present

## 2021-03-10 DIAGNOSIS — K828 Other specified diseases of gallbladder: Secondary | ICD-10-CM | POA: Diagnosis not present

## 2021-03-10 DIAGNOSIS — I129 Hypertensive chronic kidney disease with stage 1 through stage 4 chronic kidney disease, or unspecified chronic kidney disease: Secondary | ICD-10-CM | POA: Diagnosis not present

## 2021-03-10 DIAGNOSIS — K821 Hydrops of gallbladder: Secondary | ICD-10-CM | POA: Diagnosis not present

## 2021-03-10 DIAGNOSIS — Z8719 Personal history of other diseases of the digestive system: Secondary | ICD-10-CM | POA: Diagnosis not present

## 2021-03-10 DIAGNOSIS — Z87891 Personal history of nicotine dependence: Secondary | ICD-10-CM | POA: Diagnosis not present

## 2021-03-10 DIAGNOSIS — N189 Chronic kidney disease, unspecified: Secondary | ICD-10-CM | POA: Diagnosis not present

## 2021-03-10 DIAGNOSIS — C9230 Myeloid sarcoma, not having achieved remission: Secondary | ICD-10-CM | POA: Diagnosis not present

## 2021-03-10 DIAGNOSIS — Z9484 Stem cells transplant status: Secondary | ICD-10-CM | POA: Diagnosis not present

## 2021-03-10 DIAGNOSIS — Z9889 Other specified postprocedural states: Secondary | ICD-10-CM | POA: Diagnosis not present

## 2021-03-10 DIAGNOSIS — K811 Chronic cholecystitis: Secondary | ICD-10-CM | POA: Diagnosis not present

## 2021-03-10 DIAGNOSIS — F4312 Post-traumatic stress disorder, chronic: Secondary | ICD-10-CM | POA: Diagnosis not present

## 2021-03-10 DIAGNOSIS — K8309 Other cholangitis: Secondary | ICD-10-CM | POA: Diagnosis not present

## 2021-03-10 DIAGNOSIS — D649 Anemia, unspecified: Secondary | ICD-10-CM | POA: Diagnosis not present

## 2021-03-10 DIAGNOSIS — K831 Obstruction of bile duct: Secondary | ICD-10-CM | POA: Diagnosis not present

## 2021-03-10 DIAGNOSIS — I824Z2 Acute embolism and thrombosis of unspecified deep veins of left distal lower extremity: Secondary | ICD-10-CM | POA: Diagnosis not present

## 2021-03-10 DIAGNOSIS — E785 Hyperlipidemia, unspecified: Secondary | ICD-10-CM | POA: Diagnosis not present

## 2021-05-25 DIAGNOSIS — D487 Neoplasm of uncertain behavior of other specified sites: Secondary | ICD-10-CM | POA: Diagnosis not present

## 2021-05-25 DIAGNOSIS — C4442 Squamous cell carcinoma of skin of scalp and neck: Secondary | ICD-10-CM | POA: Diagnosis not present

## 2021-05-25 DIAGNOSIS — C9202 Acute myeloblastic leukemia, in relapse: Secondary | ICD-10-CM | POA: Diagnosis not present

## 2021-05-25 DIAGNOSIS — D229 Melanocytic nevi, unspecified: Secondary | ICD-10-CM | POA: Diagnosis not present

## 2021-05-25 DIAGNOSIS — L821 Other seborrheic keratosis: Secondary | ICD-10-CM | POA: Diagnosis not present

## 2021-05-25 DIAGNOSIS — C44519 Basal cell carcinoma of skin of other part of trunk: Secondary | ICD-10-CM | POA: Diagnosis not present

## 2021-06-05 DIAGNOSIS — Z9049 Acquired absence of other specified parts of digestive tract: Secondary | ICD-10-CM | POA: Diagnosis not present

## 2021-06-05 DIAGNOSIS — C9202 Acute myeloblastic leukemia, in relapse: Secondary | ICD-10-CM | POA: Diagnosis not present

## 2021-06-05 DIAGNOSIS — N1832 Chronic kidney disease, stage 3b: Secondary | ICD-10-CM | POA: Diagnosis not present

## 2021-06-05 DIAGNOSIS — Z95828 Presence of other vascular implants and grafts: Secondary | ICD-10-CM | POA: Diagnosis not present

## 2021-06-05 DIAGNOSIS — Z792 Long term (current) use of antibiotics: Secondary | ICD-10-CM | POA: Diagnosis not present

## 2021-06-05 DIAGNOSIS — C9231 Myeloid sarcoma, in remission: Secondary | ICD-10-CM | POA: Diagnosis not present

## 2021-06-05 DIAGNOSIS — Z9889 Other specified postprocedural states: Secondary | ICD-10-CM | POA: Diagnosis not present

## 2021-06-05 DIAGNOSIS — Z79899 Other long term (current) drug therapy: Secondary | ICD-10-CM | POA: Diagnosis not present

## 2021-06-05 DIAGNOSIS — Z86718 Personal history of other venous thrombosis and embolism: Secondary | ICD-10-CM | POA: Diagnosis not present

## 2021-06-05 DIAGNOSIS — Z9484 Stem cells transplant status: Secondary | ICD-10-CM | POA: Diagnosis not present

## 2021-06-21 DIAGNOSIS — C9202 Acute myeloblastic leukemia, in relapse: Secondary | ICD-10-CM | POA: Diagnosis not present

## 2021-06-21 DIAGNOSIS — N529 Male erectile dysfunction, unspecified: Secondary | ICD-10-CM | POA: Diagnosis not present

## 2021-06-21 DIAGNOSIS — E7800 Pure hypercholesterolemia, unspecified: Secondary | ICD-10-CM | POA: Diagnosis not present

## 2021-06-21 DIAGNOSIS — D229 Melanocytic nevi, unspecified: Secondary | ICD-10-CM | POA: Diagnosis not present

## 2021-07-06 DIAGNOSIS — Q909 Down syndrome, unspecified: Secondary | ICD-10-CM | POA: Diagnosis not present

## 2021-07-06 DIAGNOSIS — Z95828 Presence of other vascular implants and grafts: Secondary | ICD-10-CM | POA: Diagnosis not present

## 2021-07-06 DIAGNOSIS — Z9484 Stem cells transplant status: Secondary | ICD-10-CM | POA: Diagnosis not present

## 2021-07-06 DIAGNOSIS — C9202 Acute myeloblastic leukemia, in relapse: Secondary | ICD-10-CM | POA: Diagnosis not present

## 2021-07-20 DIAGNOSIS — Z23 Encounter for immunization: Secondary | ICD-10-CM | POA: Diagnosis not present

## 2021-08-10 DIAGNOSIS — C9202 Acute myeloblastic leukemia, in relapse: Secondary | ICD-10-CM | POA: Diagnosis not present

## 2021-08-10 DIAGNOSIS — C9000 Multiple myeloma not having achieved remission: Secondary | ICD-10-CM | POA: Diagnosis not present

## 2021-08-10 DIAGNOSIS — Z95828 Presence of other vascular implants and grafts: Secondary | ICD-10-CM | POA: Diagnosis not present

## 2021-08-10 DIAGNOSIS — C44529 Squamous cell carcinoma of skin of other part of trunk: Secondary | ICD-10-CM | POA: Diagnosis not present

## 2021-08-10 DIAGNOSIS — Z9484 Stem cells transplant status: Secondary | ICD-10-CM | POA: Diagnosis not present

## 2021-09-05 DIAGNOSIS — D89811 Chronic graft-versus-host disease: Secondary | ICD-10-CM | POA: Diagnosis not present

## 2021-09-05 DIAGNOSIS — Z85828 Personal history of other malignant neoplasm of skin: Secondary | ICD-10-CM | POA: Diagnosis not present

## 2021-09-05 DIAGNOSIS — C9202 Acute myeloblastic leukemia, in relapse: Secondary | ICD-10-CM | POA: Diagnosis not present

## 2021-09-05 DIAGNOSIS — R21 Rash and other nonspecific skin eruption: Secondary | ICD-10-CM | POA: Diagnosis not present

## 2021-09-05 DIAGNOSIS — L308 Other specified dermatitis: Secondary | ICD-10-CM | POA: Diagnosis not present

## 2021-09-15 DIAGNOSIS — H539 Unspecified visual disturbance: Secondary | ICD-10-CM | POA: Diagnosis not present

## 2021-09-15 DIAGNOSIS — D89813 Graft-versus-host disease, unspecified: Secondary | ICD-10-CM | POA: Diagnosis not present

## 2021-09-15 DIAGNOSIS — H04123 Dry eye syndrome of bilateral lacrimal glands: Secondary | ICD-10-CM | POA: Diagnosis not present

## 2021-09-15 DIAGNOSIS — H547 Unspecified visual loss: Secondary | ICD-10-CM | POA: Diagnosis not present

## 2021-09-15 DIAGNOSIS — H25813 Combined forms of age-related cataract, bilateral: Secondary | ICD-10-CM | POA: Diagnosis not present

## 2021-09-15 DIAGNOSIS — Z79899 Other long term (current) drug therapy: Secondary | ICD-10-CM | POA: Diagnosis not present

## 2021-10-13 DIAGNOSIS — H524 Presbyopia: Secondary | ICD-10-CM | POA: Diagnosis not present

## 2021-10-20 DIAGNOSIS — H04123 Dry eye syndrome of bilateral lacrimal glands: Secondary | ICD-10-CM | POA: Diagnosis not present

## 2021-10-26 DIAGNOSIS — H04123 Dry eye syndrome of bilateral lacrimal glands: Secondary | ICD-10-CM | POA: Diagnosis not present

## 2021-10-31 DIAGNOSIS — I82462 Acute embolism and thrombosis of left calf muscular vein: Secondary | ICD-10-CM | POA: Diagnosis not present

## 2021-10-31 DIAGNOSIS — I82442 Acute embolism and thrombosis of left tibial vein: Secondary | ICD-10-CM | POA: Diagnosis not present

## 2021-10-31 DIAGNOSIS — I82452 Acute embolism and thrombosis of left peroneal vein: Secondary | ICD-10-CM | POA: Diagnosis not present

## 2021-10-31 DIAGNOSIS — I82412 Acute embolism and thrombosis of left femoral vein: Secondary | ICD-10-CM | POA: Diagnosis not present

## 2021-12-01 DIAGNOSIS — Z9484 Stem cells transplant status: Secondary | ICD-10-CM | POA: Diagnosis not present

## 2021-12-01 DIAGNOSIS — C792 Secondary malignant neoplasm of skin: Secondary | ICD-10-CM | POA: Diagnosis not present

## 2021-12-01 DIAGNOSIS — C9202 Acute myeloblastic leukemia, in relapse: Secondary | ICD-10-CM | POA: Diagnosis not present

## 2021-12-01 DIAGNOSIS — R7989 Other specified abnormal findings of blood chemistry: Secondary | ICD-10-CM | POA: Diagnosis not present

## 2021-12-01 DIAGNOSIS — Z95828 Presence of other vascular implants and grafts: Secondary | ICD-10-CM | POA: Diagnosis not present

## 2021-12-01 DIAGNOSIS — C9231 Myeloid sarcoma, in remission: Secondary | ICD-10-CM | POA: Diagnosis not present

## 2021-12-01 DIAGNOSIS — C4442 Squamous cell carcinoma of skin of scalp and neck: Secondary | ICD-10-CM | POA: Diagnosis not present

## 2021-12-08 DIAGNOSIS — L570 Actinic keratosis: Secondary | ICD-10-CM | POA: Diagnosis not present

## 2021-12-08 DIAGNOSIS — L821 Other seborrheic keratosis: Secondary | ICD-10-CM | POA: Diagnosis not present

## 2021-12-08 DIAGNOSIS — L988 Other specified disorders of the skin and subcutaneous tissue: Secondary | ICD-10-CM | POA: Diagnosis not present

## 2021-12-08 DIAGNOSIS — Z85828 Personal history of other malignant neoplasm of skin: Secondary | ICD-10-CM | POA: Diagnosis not present

## 2021-12-08 DIAGNOSIS — D89813 Graft-versus-host disease, unspecified: Secondary | ICD-10-CM | POA: Diagnosis not present

## 2021-12-29 DIAGNOSIS — H18213 Corneal edema secondary to contact lens, bilateral: Secondary | ICD-10-CM | POA: Diagnosis not present

## 2022-02-23 DIAGNOSIS — Z79899 Other long term (current) drug therapy: Secondary | ICD-10-CM | POA: Diagnosis not present

## 2022-02-23 DIAGNOSIS — Z95828 Presence of other vascular implants and grafts: Secondary | ICD-10-CM | POA: Diagnosis not present

## 2022-02-23 DIAGNOSIS — C4442 Squamous cell carcinoma of skin of scalp and neck: Secondary | ICD-10-CM | POA: Diagnosis not present

## 2022-02-23 DIAGNOSIS — C9231 Myeloid sarcoma, in remission: Secondary | ICD-10-CM | POA: Diagnosis not present

## 2022-02-23 DIAGNOSIS — C792 Secondary malignant neoplasm of skin: Secondary | ICD-10-CM | POA: Diagnosis not present

## 2022-02-23 DIAGNOSIS — C9202 Acute myeloblastic leukemia, in relapse: Secondary | ICD-10-CM | POA: Diagnosis not present

## 2022-02-23 DIAGNOSIS — Z5111 Encounter for antineoplastic chemotherapy: Secondary | ICD-10-CM | POA: Diagnosis not present

## 2022-02-23 DIAGNOSIS — Z9484 Stem cells transplant status: Secondary | ICD-10-CM | POA: Diagnosis not present

## 2022-03-13 DIAGNOSIS — Z792 Long term (current) use of antibiotics: Secondary | ICD-10-CM | POA: Diagnosis not present

## 2022-03-13 DIAGNOSIS — D89813 Graft-versus-host disease, unspecified: Secondary | ICD-10-CM | POA: Diagnosis not present

## 2022-03-13 DIAGNOSIS — Z9484 Stem cells transplant status: Secondary | ICD-10-CM | POA: Diagnosis not present

## 2022-03-13 DIAGNOSIS — C9202 Acute myeloblastic leukemia, in relapse: Secondary | ICD-10-CM | POA: Diagnosis not present

## 2022-03-13 DIAGNOSIS — Z79899 Other long term (current) drug therapy: Secondary | ICD-10-CM | POA: Diagnosis not present

## 2022-03-13 DIAGNOSIS — Z23 Encounter for immunization: Secondary | ICD-10-CM | POA: Diagnosis not present

## 2022-04-03 DIAGNOSIS — I951 Orthostatic hypotension: Secondary | ICD-10-CM | POA: Diagnosis not present

## 2022-04-03 DIAGNOSIS — C9231 Myeloid sarcoma, in remission: Secondary | ICD-10-CM | POA: Diagnosis not present

## 2022-04-03 DIAGNOSIS — N183 Chronic kidney disease, stage 3 unspecified: Secondary | ICD-10-CM | POA: Diagnosis not present

## 2022-04-03 DIAGNOSIS — E876 Hypokalemia: Secondary | ICD-10-CM | POA: Diagnosis not present

## 2022-04-03 DIAGNOSIS — Z9484 Stem cells transplant status: Secondary | ICD-10-CM | POA: Diagnosis not present

## 2022-04-03 DIAGNOSIS — Z79899 Other long term (current) drug therapy: Secondary | ICD-10-CM | POA: Diagnosis not present

## 2022-04-03 DIAGNOSIS — D89813 Graft-versus-host disease, unspecified: Secondary | ICD-10-CM | POA: Diagnosis not present

## 2022-04-03 DIAGNOSIS — N179 Acute kidney failure, unspecified: Secondary | ICD-10-CM | POA: Diagnosis not present

## 2022-04-03 DIAGNOSIS — Z95828 Presence of other vascular implants and grafts: Secondary | ICD-10-CM | POA: Diagnosis not present

## 2022-04-03 DIAGNOSIS — D630 Anemia in neoplastic disease: Secondary | ICD-10-CM | POA: Diagnosis not present

## 2022-04-03 DIAGNOSIS — R7989 Other specified abnormal findings of blood chemistry: Secondary | ICD-10-CM | POA: Diagnosis not present

## 2022-04-03 DIAGNOSIS — C9202 Acute myeloblastic leukemia, in relapse: Secondary | ICD-10-CM | POA: Diagnosis not present

## 2022-06-25 DIAGNOSIS — Z9484 Stem cells transplant status: Secondary | ICD-10-CM | POA: Diagnosis not present

## 2022-06-25 DIAGNOSIS — J849 Interstitial pulmonary disease, unspecified: Secondary | ICD-10-CM | POA: Diagnosis not present

## 2022-06-25 DIAGNOSIS — C9201 Acute myeloblastic leukemia, in remission: Secondary | ICD-10-CM | POA: Diagnosis not present

## 2022-06-25 DIAGNOSIS — C9231 Myeloid sarcoma, in remission: Secondary | ICD-10-CM | POA: Diagnosis not present

## 2022-06-28 DIAGNOSIS — J849 Interstitial pulmonary disease, unspecified: Secondary | ICD-10-CM | POA: Diagnosis not present

## 2022-07-28 DIAGNOSIS — A812 Progressive multifocal leukoencephalopathy: Secondary | ICD-10-CM | POA: Diagnosis not present

## 2022-07-28 DIAGNOSIS — K219 Gastro-esophageal reflux disease without esophagitis: Secondary | ICD-10-CM | POA: Diagnosis not present

## 2022-07-28 DIAGNOSIS — D849 Immunodeficiency, unspecified: Secondary | ICD-10-CM | POA: Diagnosis not present

## 2022-07-28 DIAGNOSIS — Z66 Do not resuscitate: Secondary | ICD-10-CM | POA: Diagnosis not present

## 2022-07-28 DIAGNOSIS — W19XXXA Unspecified fall, initial encounter: Secondary | ICD-10-CM | POA: Diagnosis not present

## 2022-07-28 DIAGNOSIS — D89811 Chronic graft-versus-host disease: Secondary | ICD-10-CM | POA: Diagnosis not present

## 2022-07-28 DIAGNOSIS — I676 Nonpyogenic thrombosis of intracranial venous system: Secondary | ICD-10-CM | POA: Diagnosis not present

## 2022-07-28 DIAGNOSIS — I129 Hypertensive chronic kidney disease with stage 1 through stage 4 chronic kidney disease, or unspecified chronic kidney disease: Secondary | ICD-10-CM | POA: Diagnosis not present

## 2022-07-28 DIAGNOSIS — F32A Depression, unspecified: Secondary | ICD-10-CM | POA: Diagnosis not present

## 2022-07-28 DIAGNOSIS — E7800 Pure hypercholesterolemia, unspecified: Secondary | ICD-10-CM | POA: Diagnosis not present

## 2022-07-28 DIAGNOSIS — N1832 Chronic kidney disease, stage 3b: Secondary | ICD-10-CM | POA: Diagnosis not present

## 2022-07-28 DIAGNOSIS — C9202 Acute myeloblastic leukemia, in relapse: Secondary | ICD-10-CM | POA: Diagnosis not present

## 2022-07-28 DIAGNOSIS — R9082 White matter disease, unspecified: Secondary | ICD-10-CM | POA: Diagnosis not present

## 2022-07-28 DIAGNOSIS — C9231 Myeloid sarcoma, in remission: Secondary | ICD-10-CM | POA: Diagnosis not present

## 2022-07-28 DIAGNOSIS — S0003XA Contusion of scalp, initial encounter: Secondary | ICD-10-CM | POA: Diagnosis not present

## 2022-07-28 DIAGNOSIS — L89152 Pressure ulcer of sacral region, stage 2: Secondary | ICD-10-CM | POA: Diagnosis not present

## 2022-07-28 DIAGNOSIS — R531 Weakness: Secondary | ICD-10-CM | POA: Diagnosis not present

## 2022-07-28 DIAGNOSIS — G936 Cerebral edema: Secondary | ICD-10-CM | POA: Diagnosis not present

## 2022-08-23 DIAGNOSIS — C9261 Acute myeloid leukemia with 11q23-abnormality in remission: Secondary | ICD-10-CM | POA: Diagnosis not present

## 2022-08-23 DIAGNOSIS — A812 Progressive multifocal leukoencephalopathy: Secondary | ICD-10-CM | POA: Diagnosis not present

## 2022-08-23 DIAGNOSIS — M6281 Muscle weakness (generalized): Secondary | ICD-10-CM | POA: Diagnosis not present

## 2022-08-23 DIAGNOSIS — I10 Essential (primary) hypertension: Secondary | ICD-10-CM | POA: Diagnosis not present

## 2022-08-24 ENCOUNTER — Non-Acute Institutional Stay: Payer: Medicare Other | Admitting: Student

## 2022-08-24 DIAGNOSIS — C9202 Acute myeloblastic leukemia, in relapse: Secondary | ICD-10-CM

## 2022-08-24 DIAGNOSIS — Z515 Encounter for palliative care: Secondary | ICD-10-CM

## 2022-08-24 DIAGNOSIS — A812 Progressive multifocal leukoencephalopathy: Secondary | ICD-10-CM

## 2022-08-24 DIAGNOSIS — R531 Weakness: Secondary | ICD-10-CM

## 2022-08-24 NOTE — Progress Notes (Unsigned)
[facility] Palliative Care Consult Note Telephone: [phone]  Fax: [phone]   Date of encounter: 08/24/22 3:54 PM PATIENT NAME: Peter Hensley [place] [address]   [phone] (home)  DOB: 02-13-1951 MRN: [id number] PRIMARY CARE PROVIDER:    Carlos Levering, PA-C,  [address] [place] [address] [phone]  REFERRING PROVIDER:   Carlos Levering, PA-C [address] [place],  [place] [address] [phone]  RESPONSIBLE PARTY:    Contact Information     Name Relation Home Work Mobile   Thurow,Connie Spouse [phone]  [phone]   Derel, [place] [phone]          I met face to face with patient and family in *** home/facility. Palliative Care was asked to follow this patient by consultation request of  Carlos Levering, PA-C to address advance care planning and complex medical decision making. This is the initial visit.                                     ASSESSMENT AND PLAN / RECOMMENDATIONS:   Advance Care Planning/Goals of Care: Goals include to maximize quality of life and symptom management. Patient/health care surrogate gave his/her permission to discuss.Our advance care planning conversation included a discussion about:    The value and importance of advance care planning  Experiences with loved ones who have been seriously ill or have died  Exploration of personal, cultural or spiritual beliefs that might influence medical decisions  Exploration of goals of care in the event of a sudden injury or illness  Identification  of a healthcare agent  Review and updating or creation of an  advance directive document . Decision not to resuscitate or to de-escalate disease focused treatments due to poor prognosis. CODE STATUS:  Symptom Management/Plan:    Follow up Palliative Care Visit: Palliative care will continue to follow for complex medical  decision making, advance care planning, and clarification of goals. Return *** weeks or prn.  I spent *** minutes providing this consultation. More than 50% of the time in this consultation was spent in counseling and care coordination.  This visit was coded based on medical decision making (MDM).***  PPS: ***0%  HOSPICE ELIGIBILITY/DIAGNOSIS: TBD  Chief Complaint: ***  HISTORY OF PRESENT ILLNESS:  Peter Hensley is a 71 y.o. year old male  with *** .   History obtained from review of EMR, discussion with primary team, and interview with family, facility staff/caregiver and/or Mr. Gamarra.  I reviewed available labs, medications, imaging, studies and related documents from the EMR.  Records reviewed and summarized above.   ROS  *** General: NAD EYES: denies vision changes ENMT: denies dysphagia Cardiovascular: denies chest pain, denies DOE Pulmonary: denies cough, denies increased SOB Abdomen: endorses good appetite, denies constipation, endorses continence of bowel GU: denies dysuria, endorses continence of urine MSK:  denies increased weakness,  no falls reported Skin: denies rashes or wounds Neurological: denies pain, denies insomnia Psych: Endorses positive mood Heme/lymph/immuno: denies bruises, abnormal bleeding  Physical Exam: Current and past weights: Constitutional: NAD General: frail appearing, thin/WNWD/obese  EYES: anicteric sclera, lids intact, no discharge  ENMT: intact hearing, oral mucous membranes moist, dentition intact CV: S1S2, RRR, no LE edema Pulmonary: LCTA, no increased work of breathing, no cough, room air Abdomen: intake 100%, normo-active BS + 4 quadrants, soft and non  tender, no ascites GU: deferred MSK: no sarcopenia, moves all extremities, ambulatory Skin: warm and dry, no rashes or wounds on visible skin Neuro:  no generalized weakness,  no cognitive impairment Psych: non-anxious affect, A and O x 3 Hem/lymph/immuno: no widespread  bruising CURRENT PROBLEM LIST:  Patient Active Problem List   Diagnosis Date Noted   Fracture of lateral malleolus of right ankle 02/03/2016   Fracture of right ankle, lateral malleolus 02/03/2016   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/01/2011   Dyslipidemia 05/01/2011   PAST MEDICAL HISTORY:  Active Ambulatory Problems    Diagnosis Date Noted   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/01/2011   Dyslipidemia 05/01/2011   Fracture of lateral malleolus of right ankle 02/03/2016   Fracture of right ankle, lateral malleolus 02/03/2016   Resolved Ambulatory Problems    Diagnosis Date Noted   No Resolved Ambulatory Problems   Past Medical History:  Diagnosis Date   Acute iritis of both eyes    Atypical chest pain    Bimalleolar ankle fracture    Depression    Dysrhythmia    Eczema    History of kidney stones    Post traumatic stress disorder    PVC's (premature ventricular contractions)    Urinary hesitancy    Wears glasses    SOCIAL HX:  Social History   Tobacco Use   Smoking status: Former    Packs/day: 0.50    Years: 10.00    Total pack years: 5.00    Types: Cigarettes    Quit date: 10/15/1976    Years since quitting: 45.8   Smokeless tobacco: Never  Substance Use Topics   Alcohol use: Yes    Comment: Occas.    FAMILY HX: No family history on file.    ALLERGIES: No Known Allergies   PERTINENT MEDICATIONS:  Outpatient Encounter Medications as of 08/24/2022  Medication Sig   sertraline (ZOLOFT) 50 MG tablet Take 50 mg by mouth daily.     simvastatin (ZOCOR) 40 MG tablet Take 40 mg by mouth at bedtime.     No facility-administered encounter medications on file as of 08/24/2022.   Thank you for the opportunity to participate in the care of Mr. Poffenberger.  The palliative care team will continue to follow. Please call our office at [phone] if we can be of additional assistance.   Ezekiel Slocumb, NP ,   COVID-19 PATIENT SCREENING TOOL Asked and negative  response unless otherwise noted:  Have you had symptoms of covid, tested positive or been in contact with someone with symptoms/positive test in the past 5-10 days?

## 2022-08-27 ENCOUNTER — Non-Acute Institutional Stay: Payer: Medicare Other

## 2022-08-27 VITALS — HR 94 | Temp 97.5°F

## 2022-08-27 DIAGNOSIS — A812 Progressive multifocal leukoencephalopathy: Secondary | ICD-10-CM | POA: Diagnosis not present

## 2022-08-27 DIAGNOSIS — M6281 Muscle weakness (generalized): Secondary | ICD-10-CM | POA: Diagnosis not present

## 2022-08-27 DIAGNOSIS — C9261 Acute myeloid leukemia with 11q23-abnormality in remission: Secondary | ICD-10-CM | POA: Diagnosis not present

## 2022-08-27 DIAGNOSIS — Z515 Encounter for palliative care: Secondary | ICD-10-CM

## 2022-08-27 DIAGNOSIS — I10 Essential (primary) hypertension: Secondary | ICD-10-CM | POA: Diagnosis not present

## 2022-08-27 NOTE — Progress Notes (Signed)
PATIENT NAME: Peter Hensley DOB: [birth year] MRN: [id number]  PRIMARY CARE PROVIDER: Carlos Levering, PA-C  RESPONSIBLE PARTY:  Acct ID - Guarantor Home Phone Work Phone Relationship Acct Type  [id number] [name], [name]  Self P/F     [address], [place], [place] [address]  Visit completed with sister [name], brother and sister-in-law  Pain:  Patient minimally engaged during this visit.  Holding a stress ball, squeezing frequently and hitting himself with this during the visit.  Patient was able to state he is having abdominal and "butt" pain.  Tiffany, LPN notified and administered an oxycodone during this visit.  Family feels patient's pain is not well managed.  It does not appear patient has received any pain medication since yesterday.  Discussed scheduling pain medication to provide better coverage for patient with LPN and ST.  NP present at the facility and has been updated by staff.  Skin:  Family advised patient does not have any skin breakdown at this time. Patient was assessed for skin breakdown yesterday by niece who is also a Marine scientist per [name].  Encouraged frequently turning/position to reduce pressure to buttocks.  Swallowing:  Patient is no longer able to chew and swallow regular foods.  Staff is now giving more softer foods due to pocketing.  [name], ST present and will further assess need to down-grade diet to mechanical soft.  Hospice vs Palliative Care:  Per [name], options were provided by [facility] prior to discharge. Elective treatment, hospice and PC were discussed. They elected Palliative Care so patient could enter facility on skilled days.  [name] states family is still open to hospice to provide a better quality of life and symptom management.  [name] states facility is having a meeting tomorrow at 130 to further address therapy vs transition to hospice.    CODE STATUS: DNR ADVANCED DIRECTIVES: Yes but not on file MOST FORM: No PPS:  20%   PHYSICAL EXAM:   VITALS: Today's Vitals   08/27/22 1219  Pulse: 94  Temp: (!) 97.5 F (36.4 C)  SpO2: (!) 88%    LUNGS: clear to auscultation  CARDIAC: Cor RRR}  EXTREMITIES: - for edema SKIN: Skin color, texture, turgor normal. No rashes or lesions or multiple areas of bruising present to bilateral hands and arms.   NEURO: positive for gait problems       Lorenza Burton, RN

## 2022-08-28 DIAGNOSIS — I10 Essential (primary) hypertension: Secondary | ICD-10-CM | POA: Diagnosis not present

## 2022-08-28 DIAGNOSIS — M6281 Muscle weakness (generalized): Secondary | ICD-10-CM | POA: Diagnosis not present

## 2022-08-28 DIAGNOSIS — A812 Progressive multifocal leukoencephalopathy: Secondary | ICD-10-CM | POA: Diagnosis not present

## 2022-08-28 DIAGNOSIS — C9261 Acute myeloid leukemia with 11q23-abnormality in remission: Secondary | ICD-10-CM | POA: Diagnosis not present

## 2022-09-14 DEATH — deceased
